# Patient Record
Sex: Female | Born: 1975 | Race: White | Hispanic: No | Smoking: Current every day smoker
Health system: Southern US, Community
[De-identification: ages and names within clinical notes are randomized; demographics above are authoritative.]

## PROBLEM LIST (undated history)

## (undated) DIAGNOSIS — M5430 Sciatica, unspecified side: Secondary | ICD-10-CM

## (undated) DIAGNOSIS — M5136 Other intervertebral disc degeneration, lumbar region: Secondary | ICD-10-CM

## (undated) DIAGNOSIS — M419 Scoliosis, unspecified: Secondary | ICD-10-CM

## (undated) DIAGNOSIS — M51369 Other intervertebral disc degeneration, lumbar region without mention of lumbar back pain or lower extremity pain: Secondary | ICD-10-CM

## (undated) HISTORY — PX: CHOLECYSTECTOMY: SHX55

---

## 1998-08-23 ENCOUNTER — Ambulatory Visit (HOSPITAL_COMMUNITY): Admission: RE | Admit: 1998-08-23 | Discharge: 1998-08-23 | Payer: Self-pay | Admitting: Obstetrics and Gynecology

## 1998-08-26 ENCOUNTER — Other Ambulatory Visit: Admission: RE | Admit: 1998-08-26 | Discharge: 1998-08-26 | Payer: Self-pay | Admitting: Obstetrics and Gynecology

## 1999-04-15 ENCOUNTER — Inpatient Hospital Stay (HOSPITAL_COMMUNITY): Admission: AD | Admit: 1999-04-15 | Discharge: 1999-04-15 | Payer: Self-pay | Admitting: *Deleted

## 1999-04-26 ENCOUNTER — Inpatient Hospital Stay (HOSPITAL_COMMUNITY): Admission: AD | Admit: 1999-04-26 | Discharge: 1999-04-26 | Payer: Self-pay | Admitting: Obstetrics & Gynecology

## 1999-05-23 ENCOUNTER — Inpatient Hospital Stay (HOSPITAL_COMMUNITY): Admission: AD | Admit: 1999-05-23 | Discharge: 1999-05-25 | Payer: Self-pay | Admitting: Obstetrics and Gynecology

## 1999-06-23 ENCOUNTER — Other Ambulatory Visit: Admission: RE | Admit: 1999-06-23 | Discharge: 1999-06-23 | Payer: Self-pay | Admitting: Obstetrics and Gynecology

## 2002-03-30 ENCOUNTER — Emergency Department (HOSPITAL_COMMUNITY): Admission: EM | Admit: 2002-03-30 | Discharge: 2002-03-30 | Payer: Self-pay | Admitting: Emergency Medicine

## 2002-11-10 ENCOUNTER — Other Ambulatory Visit: Admission: RE | Admit: 2002-11-10 | Discharge: 2002-11-10 | Payer: Self-pay | Admitting: Obstetrics and Gynecology

## 2009-12-11 ENCOUNTER — Emergency Department
Admit: 2009-12-11 | Discharge: 2009-12-13 | Disposition: A | Discharge status: Home Health | Payer: Self-pay | Attending: Emergency Medicine | Admitting: Emergency Medicine

## 2009-12-11 LAB — CBC WITH DIFF, BLOOD
Abs Basophils: 0.1 10*3/uL (ref 0.0–0.1)
Abs Eosinophils: 0.3 10*3/uL (ref 0.0–0.5)
Abs Lymphs: 3.4 10*3/uL — ABNORMAL HIGH (ref 0.8–3.1)
Abs Monos: 0.5 10*3/uL (ref 0.2–0.8)
AbsoLute Lymphocyte Count: 3.4 10*3/uL
Absolute Neutrophil Count: 8.8 10*3/uL — ABNORMAL HIGH (ref 1.6–7.0)
Bands: 3 % (ref 0–15)
Basophils: 1 % (ref 0–2)
Eosinophils: 2 % (ref 1–7)
Hct: 38.5 % (ref 34.0–45.0)
Hgb: 12.9 g/dL (ref 11.2–15.7)
Lymphocytes: 26 % (ref 19–53)
MCH: 29.8 pg (ref 26.0–32.0)
MCHC: 33.5 % (ref 32.0–36.0)
MCV: 88.9 um3 (ref 79.0–95.0)
MPV: 11.3 fL (ref 9.4–12.4)
Monocytes: 4 % — ABNORMAL LOW (ref 5–12)
Number of Cells Counted: 117
Plt Count: 280 10*3/uL (ref 160–370)
Plt Est: NORMAL
RBC: 4.33 10*6/uL (ref 3.90–5.20)
RDW: 14 % (ref 12.0–14.0)
Segs: 64 % (ref 34–71)
WBC: 13.2 10*3/uL — ABNORMAL HIGH (ref 4.0–10.0)

## 2009-12-11 LAB — COMPREHENSIVE METABOLIC PANEL, BLOOD
ALT (SGPT): INVALID U/L — CR (ref 0–33)
ALT (SGPT): 10 U/L (ref 0–33)
AST (SGOT): INVALID U/L — CR (ref 0–32)
AST (SGOT): 11 U/L (ref 0–32)
Albumin: INVALID g/dL — CR (ref 3.5–5.2)
Albumin: 4.3 g/dL (ref 3.5–5.2)
Alkaline Phos: INVALID U/L — CR (ref 35–140)
Alkaline Phos: 67 U/L (ref 35–140)
BUN: INVALID mg/dL — CR (ref 6–20)
BUN: 7 mg/dL (ref 6–20)
Bicarbonate: INVALID mmol/L — CR (ref 22–29)
Bicarbonate: 26 mmol/L (ref 22–29)
Bilirubin, Tot: INVALID mg/dL — CR (ref <1.2)
Bilirubin, Tot: 0.2 mg/dL (ref <1.2)
Calcium: INVALID mg/dL — CR (ref 8.6–10.5)
Calcium: 8.7 mg/dL (ref 8.6–10.5)
Chloride: INVALID mmol/L — CR (ref 98–107)
Chloride: 104 mmol/L (ref 98–107)
Creatinine: INVALID mg/dL — CR (ref 0.51–0.95)
Creatinine: 0.83 mg/dL (ref 0.51–0.95)
Glucose: INVALID mg/dL — CR (ref 70–115)
Glucose: 107 mg/dL (ref 70–115)
Potassium: INVALID mmol/L — CR (ref 3.5–5.1)
Potassium: 3.6 mmol/L (ref 3.5–5.1)
Sodium: INVALID mmol/L — CR (ref 136–145)
Sodium: 140 mmol/L (ref 136–145)
Total Protein: INVALID g/dL — CR (ref 6.0–8.0)
Total Protein: 6.5 g/dL (ref 6.0–8.0)

## 2009-12-11 LAB — BILIRUBIN, DIR BLOOD
Bilirubin, Dir: INVALID mg/dL — CR (ref <0.2)
Bilirubin, Dir: 0.1 mg/dL (ref <0.2)

## 2009-12-11 LAB — GFR
GFR: INVALID mL/min — CR
GFR: >60 mL/min

## 2009-12-11 LAB — URINALYSIS
Bilirubin: NEGATIVE
Glucose: NEGATIVE
Ketones: NEGATIVE
Nitrite: NEGATIVE
Specific Gravity: 1.017 (ref 1.002–1.030)
pH: 6 (ref 5.0–8.0)

## 2009-12-11 LAB — LIPASE, BLOOD
Lipase: INVALID U/L — CR (ref 13–60)
Lipase: 59 U/L (ref 13–60)

## 2009-12-11 LAB — HEMOLYSIS

## 2009-12-11 LAB — URINE HCG: HCG, Urine: NEGATIVE

## 2009-12-13 LAB — URINE CULTURE

## 2016-02-28 ENCOUNTER — Ambulatory Visit (INDEPENDENT_AMBULATORY_CARE_PROVIDER_SITE_OTHER): Payer: MEDICAID | Admitting: Anesthesiology

## 2016-02-28 NOTE — Progress Notes (Unsigned)
The patient missed the appointment.

## 2016-05-21 ENCOUNTER — Encounter (HOSPITAL_COMMUNITY): Payer: Self-pay | Admitting: *Deleted

## 2016-05-21 ENCOUNTER — Emergency Department (HOSPITAL_COMMUNITY)
Admission: EM | Admit: 2016-05-21 | Discharge: 2016-05-21 | Disposition: A | Discharge status: Home / Self Care | Payer: Medicaid - Out of State | Attending: Emergency Medicine | Admitting: Emergency Medicine

## 2016-05-21 DIAGNOSIS — M549 Dorsalgia, unspecified: Secondary | ICD-10-CM

## 2016-05-21 DIAGNOSIS — G8929 Other chronic pain: Secondary | ICD-10-CM | POA: Diagnosis not present

## 2016-05-21 DIAGNOSIS — M545 Low back pain: Secondary | ICD-10-CM | POA: Diagnosis present

## 2016-05-21 MED ORDER — PREDNISONE 20 MG PO TABS: 60.0000 mg | ORAL_TABLET | Freq: Every day | ORAL | Status: DC

## 2016-05-21 MED ORDER — HYDROCODONE-ACETAMINOPHEN 5-325 MG PO TABS: 1.0000 | ORAL_TABLET | Freq: Four times a day (QID) | ORAL | Status: DC | PRN

## 2016-05-21 NOTE — ED Notes (Signed)
Pt reports a medical Hx  Of DDD  And chronic back pain. Pt needs refill on pain meds while waiting MD appt.

## 2016-05-21 NOTE — Discharge Instructions (Signed)
Please read and follow all provided instructions.  Your diagnoses today include:  1. Chronic back pain    Medications prescribed:   Take any prescribed medications only as directed.  Home care instructions:   Follow any educational materials contained in this packet  Please rest, use ice or heat on your back for the next several days  Do not lift, push, pull anything more than 10 pounds for the next week  Follow-up instructions: Please follow-up with your primary care provider in the next 1 week for further evaluation of your symptoms.   Return instructions:  SEEK IMMEDIATE MEDICAL ATTENTION IF YOU HAVE:  New numbness, tingling, weakness, or problem with the use of your arms or legs  Severe back pain not relieved with medications  Loss control of your bowels or bladder  Increasing pain in any areas of the body (such as chest or abdominal pain)  Shortness of breath, dizziness, or fainting.   Worsening nausea (feeling sick to your stomach), vomiting, fever, or sweats  Any other emergent concerns regarding your health

## 2016-05-21 NOTE — ED Notes (Signed)
Declined W/C at D/C and was escorted to lobby by RN. 

## 2016-05-21 NOTE — ED Provider Notes (Signed)
CSN: [id number]     Arrival date & time 05/21/16  [id number] History  By signing my name below, I, Tanda RockersMargaux Venter, attest that this documentation has been prepared under the direction and in the presence of Audry Piliyler Milind Raether, PA-C.  Electronically Signed: Tanda RockersMargaux Venter, ED Scribe. 05/21/2016. 9:15 AM.   No chief complaint on file.  The history is provided by the patient. No language interpreter was used.     HPI Comments: Madison Blackburn is a 40 y.o. female with PMHx DDD, who presents to the Emergency Department complaining of acute on chronic, 7/10, pulling, lower back pain x 1-2 weeks. Pt moved to the area from [place] 2 weeks ago and recently ran out of her medications. Her doctor in [place] could not call prescriptions over the phone, prompting her to come to the ED today. Pt has been taking muscle relaxants with mild relief. Pt does not know who she will see in the area but is asking for a referral to get started. Denies urinary or bowel incontinence, saddle anesthesia, gait difficulty, fever, or any other associated symptoms. No hx DM.   No past medical history on file. No past surgical history on file. No family history on file. Social History  Substance Use Topics  . Smoking status: Not on file  . Smokeless tobacco: Not on file  . Alcohol Use: Not on file   OB History    No data available     Review of Systems  Gastrointestinal:       Negative for urinary incontinence  Genitourinary:       Negative for bowel incontinence  Musculoskeletal: Positive for back pain. Negative for gait problem.  Neurological: Negative for numbness.   Allergies  Review of patient's allergies indicates not on file.  Home Medications   Prior to Admission medications   Not on File   There were no vitals taken for this visit.   Physical Exam  Constitutional: She is oriented to person, place, and time. She appears well-developed and well-nourished. No distress.  HENT:  Head: Normocephalic and  atraumatic.  Eyes: Conjunctivae and EOM are normal.  Neck: Neck supple. No tracheal deviation present.  Cardiovascular: Normal rate.   Pulmonary/Chest: Effort normal. No respiratory distress.  Musculoskeletal: Normal range of motion.       Lumbar back: She exhibits bony tenderness. She exhibits normal range of motion, no tenderness, no deformity, no laceration, no pain, no spasm and normal pulse.  Pt able to ambulate without difficulty   Neurological: She is alert and oriented to person, place, and time.  Skin: Skin is warm and dry.  Psychiatric: She has a normal mood and affect. Her behavior is normal.  Nursing note and vitals reviewed.  ED Course  Procedures (including critical care time)  COORDINATION OF CARE: 9:14 AM-Discussed treatment plan which includes Rx pain medication and prednisone with pt at bedside and pt agreed to plan.   Labs Review Labs Reviewed - No data to display  Imaging Review No results found. I have personally reviewed and evaluated these images and lab results as part of my medical decision-making.   EKG Interpretation None      MDM  I have reviewed the relevant previous healthcare records. I obtained HPI from historian.  ED Course:  Assessment: Patient with back pain.  No neurological deficits and normal neuro exam.  Patient is ambulatory.  No loss of bowel or bladder control.  No concern for cauda equina.  No fever, night sweats, weight loss,  h/o cancer, IVDA, no recent procedure to back. No urinary symptoms suggestive of UTI.  Supportive care and return precaution discussed. Appears safe for discharge at this time. Follow up as indicated in discharge paperwork.   Disposition/Plan:  DC Home Additional Verbal discharge instructions given and discussed with patient.  Pt Instructed to f/u with Ortho in the next week for evaluation and treatment of symptoms. Return precautions given Pt acknowledges and agrees with plan  Supervising Physician  Tilden FossaElizabeth Rees, MD   Final diagnoses:  Chronic back pain     I personally performed the services described in this documentation, which was scribed in my presence. The recorded information has been reviewed and is accurate.    Audry Piliyler Sandia Pfund, PA-C 05/21/16 [id number]  Tilden FossaElizabeth Rees, MD 05/23/16 [phone]

## 2016-05-23 ENCOUNTER — Ambulatory Visit (HOSPITAL_COMMUNITY)
Admission: EM | Admit: 2016-05-23 | Discharge: 2016-05-23 | Disposition: A | Discharge status: Home / Self Care | Payer: Medicaid - Out of State | Attending: Physician Assistant | Admitting: Physician Assistant

## 2016-05-23 ENCOUNTER — Encounter (HOSPITAL_COMMUNITY): Payer: Self-pay | Admitting: Emergency Medicine

## 2016-05-23 DIAGNOSIS — M549 Dorsalgia, unspecified: Principal | ICD-10-CM

## 2016-05-23 DIAGNOSIS — G8929 Other chronic pain: Secondary | ICD-10-CM

## 2016-05-23 MED ORDER — HYDROCODONE-ACETAMINOPHEN 10-325 MG PO TABS: 1.0000 | ORAL_TABLET | Freq: Four times a day (QID) | ORAL | Status: DC | PRN

## 2016-05-23 NOTE — ED Notes (Signed)
Pt d/c by frank p, pa 

## 2016-05-23 NOTE — Discharge Instructions (Signed)
Back Exercises °The following exercises strengthen the muscles that help to support the back. They also help to keep the lower back flexible. Doing these exercises can help to prevent back pain or lessen existing pain. °If you have back pain or discomfort, try doing these exercises 2-3 times each day or as told by your health care provider. When the pain goes away, do them once each day, but increase the number of times that you repeat the steps for each exercise (do more repetitions). If you do not have back pain or discomfort, do these exercises once each day or as told by your health care provider. °EXERCISES °Single Knee to Chest °Repeat these steps 3-5 times for each leg: °· Lie on your back on a firm bed or the floor with your legs extended. °· Bring one knee to your chest. Your other leg should stay extended and in contact with the floor. °· Hold your knee in place by grabbing your knee or thigh. °· Pull on your knee until you feel a gentle stretch in your lower back. °· Hold the stretch for 10-30 seconds. °· Slowly release and straighten your leg. °Pelvic Tilt °Repeat these steps 5-10 times: °· Lie on your back on a firm bed or the floor with your legs extended. °· Bend your knees so they are pointing toward the ceiling and your feet are flat on the floor. °· Tighten your lower abdominal muscles to press your lower back against the floor. This motion will tilt your pelvis so your tailbone points up toward the ceiling instead of pointing to your feet or the floor. °· With gentle tension and even breathing, hold this position for 5-10 seconds. °Cat-Cow °Repeat these steps until your lower back becomes more flexible: °· Get into a hands-and-knees position on a firm surface. Keep your hands under your shoulders, and keep your knees under your hips. You may place padding under your knees for comfort. °· Let your head hang down, and point your tailbone toward the floor so your lower back becomes rounded like the  back of a cat. °· Hold this position for 5 seconds. °· Slowly lift your head and point your tailbone up toward the ceiling so your back forms a sagging arch like the back of a cow. °· Hold this position for 5 seconds. °Press-Ups °Repeat these steps 5-10 times: °· Lie on your abdomen (face-down) on the floor. °· Place your palms near your head, about shoulder-width apart. °· While you keep your back as relaxed as possible and keep your hips on the floor, slowly straighten your arms to raise the top half of your body and lift your shoulders. Do not use your back muscles to raise your upper torso. You may adjust the placement of your hands to make yourself more comfortable. °· Hold this position for 5 seconds while you keep your back relaxed. °· Slowly return to lying flat on the floor. °Bridges °Repeat these steps 10 times: °1. Lie on your back on a firm surface. °2. Bend your knees so they are pointing toward the ceiling and your feet are flat on the floor. °3. Tighten your buttocks muscles and lift your buttocks off of the floor until your waist is at almost the same height as your knees. You should feel the muscles working in your buttocks and the back of your thighs. If you do not feel these muscles, slide your feet 1-2 inches farther away from your buttocks. °4. Hold this position for 3-5   seconds. °5. Slowly lower your hips to the starting position, and allow your buttocks muscles to relax completely. °If this exercise is too easy, try doing it with your arms crossed over your chest. °Abdominal Crunches °Repeat these steps 5-10 times: °1. Lie on your back on a firm bed or the floor with your legs extended. °2. Bend your knees so they are pointing toward the ceiling and your feet are flat on the floor. °3. Cross your arms over your chest. °4. Tip your chin slightly toward your chest without bending your neck. °5. Tighten your abdominal muscles and slowly raise your trunk (torso) high enough to lift your shoulder  blades a tiny bit off of the floor. Avoid raising your torso higher than that, because it can put too much stress on your low back and it does not help to strengthen your abdominal muscles. °6. Slowly return to your starting position. °Back Lifts °Repeat these steps 5-10 times: °1. Lie on your abdomen (face-down) with your arms at your sides, and rest your forehead on the floor. °2. Tighten the muscles in your legs and your buttocks. °3. Slowly lift your chest off of the floor while you keep your hips pressed to the floor. Keep the back of your head in line with the curve in your back. Your eyes should be looking at the floor. °4. Hold this position for 3-5 seconds. °5. Slowly return to your starting position. °SEEK MEDICAL CARE IF: °· Your back pain or discomfort gets much worse when you do an exercise. °· Your back pain or discomfort does not lessen within 2 hours after you exercise. °If you have any of these problems, stop doing these exercises right away. Do not do them again unless your health care provider says that you can. °SEEK IMMEDIATE MEDICAL CARE IF: °· You develop sudden, severe back pain. If this happens, stop doing the exercises right away. Do not do them again unless your health care provider says that you can. °  °This information is not intended to replace advice given to you by your health care provider. Make sure you discuss any questions you have with your health care provider. °  °Document Released: 12/21/2004 Document Revised: 08/04/2015 Document Reviewed: 01/07/2015 °Elsevier Interactive Patient Education ©2016 Else[employer] Inc[employer] ° °Back Pain, Adult °Back pain is very common in adults. The cause of back pain is rarely dangerous and the pain often gets better over time. The cause of your back pain may not be known. Some common causes of back pain include: °· Strain of the muscles or ligaments supporting the spine. °· Wear and tear (degeneration) of the spinal disks. °· Arthritis. °· Direct injury  to the back. °For many people, back pain may return. Since back pain is rarely dangerous, most people can learn to manage this condition on their own. °HOME CARE INSTRUCTIONS °Watch your back pain for any changes. The following actions may help to lessen any discomfort you are feeling: °· Remain active. It is stressful on your back to sit or stand in one place for long periods of time. Do not sit, drive, or stand in one place for more than 30 minutes at a time. Take short walks on even surfaces as soon as you are able. Try to increase the length of time you walk each day. °· Exercise regularly as directed by your health care provider. Exercise helps your back heal faster. It also helps avoid future injury by keeping your muscles strong and flexible. °· Do not stay in   bed. Resting more than 1-2 days can delay your recovery. °· Pay attention to your body when you bend and lift. The most comfortable positions are those that put less stress on your recovering back. Always use proper lifting techniques, including: °¨ Bending your knees. °¨ Keeping the load close to your body. °¨ Avoiding twisting. °· Find a comfortable position to sleep. Use a firm mattress and lie on your side with your knees slightly bent. If you lie on your back, put a pillow under your knees. °· Avoid feeling anxious or stressed. Stress increases muscle tension and can worsen back pain. It is important to recognize when you are anxious or stressed and learn ways to manage it, such as with exercise. °· Take medicines only as directed by your health care provider. Over-the-counter medicines to reduce pain and inflammation are often the most helpful. Your health care provider may prescribe muscle relaxant drugs. These medicines help dull your pain so you can more quickly return to your normal activities and healthy exercise. °· Apply ice to the injured area: °¨ Put ice in a plastic bag. °¨ Place a towel between your skin and the bag. °¨ Leave the ice on  for 20 minutes, 2-3 times a day for the first 2-3 days. After that, ice and heat may be alternated to reduce pain and spasms. °· Maintain a healthy weight. Excess weight puts extra stress on your back and makes it difficult to maintain good posture. °SEEK MEDICAL CARE IF: °· You have pain that is not relieved with rest or medicine. °· You have increasing pain going down into the legs or buttocks. °· You have pain that does not improve in one week. °· You have night pain. °· You lose weight. °· You have a fever or chills. °SEEK IMMEDIATE MEDICAL CARE IF:  °· You develop new bowel or bladder control problems. °· You have unusual weakness or numbness in your arms or legs. °· You develop nausea or vomiting. °· You develop abdominal pain. °· You feel faint. °  °This information is not intended to replace advice given to you by your health care provider. Make sure you discuss any questions you have with your health care provider. °  °Document Released: 11/13/2005 Document Revised: 12/04/2014 Document Reviewed: 03/17/2014 °Elsevier Interactive Patient Education ©2016 Elsevier Inc. ° °

## 2016-05-23 NOTE — ED Notes (Signed)
Here for back pain onset x4 days and medication refill on meds Reports hx of chronic back pain/DDD Just moved to [place] x1 week A&O x4... No acute distress.

## 2016-05-31 ENCOUNTER — Emergency Department (HOSPITAL_COMMUNITY)
Admission: EM | Admit: 2016-05-31 | Discharge: 2016-05-31 | Disposition: A | Discharge status: Home / Self Care | Payer: Medicaid - Out of State | Attending: Emergency Medicine | Admitting: Emergency Medicine

## 2016-05-31 ENCOUNTER — Encounter (HOSPITAL_COMMUNITY): Payer: Self-pay | Admitting: Emergency Medicine

## 2016-05-31 DIAGNOSIS — M545 Low back pain: Secondary | ICD-10-CM

## 2016-05-31 DIAGNOSIS — F17210 Nicotine dependence, cigarettes, uncomplicated: Secondary | ICD-10-CM | POA: Insufficient documentation

## 2016-05-31 DIAGNOSIS — G8929 Other chronic pain: Secondary | ICD-10-CM

## 2016-05-31 DIAGNOSIS — M5440 Lumbago with sciatica, unspecified side: Secondary | ICD-10-CM | POA: Insufficient documentation

## 2016-05-31 HISTORY — DX: Other intervertebral disc degeneration, lumbar region: M51.36

## 2016-05-31 HISTORY — DX: Sciatica, unspecified side: M54.30

## 2016-05-31 HISTORY — DX: Other intervertebral disc degeneration, lumbar region without mention of lumbar back pain or lower extremity pain: M51.369

## 2016-05-31 HISTORY — DX: Scoliosis, unspecified: M41.9

## 2016-05-31 MED ORDER — OXYCODONE-ACETAMINOPHEN 5-325 MG PO TABS
1.0000 | ORAL_TABLET | Freq: Once | ORAL | Status: AC
  Administered 2016-05-31: 1 via ORAL
  Filled 2016-05-31: qty 1

## 2016-05-31 MED ORDER — TRAMADOL HCL 50 MG PO TABS: 50.0000 mg | ORAL_TABLET | Freq: Four times a day (QID) | ORAL | Status: DC | PRN

## 2016-05-31 MED ORDER — MELOXICAM 7.5 MG PO TABS: 7.5000 mg | ORAL_TABLET | Freq: Every day | ORAL | Status: DC

## 2016-05-31 MED ORDER — NAPROXEN 500 MG PO TABS
500.0000 mg | ORAL_TABLET | Freq: Once | ORAL | Status: AC
  Administered 2016-05-31: 500 mg via ORAL
  Filled 2016-05-31: qty 1

## 2016-05-31 NOTE — Progress Notes (Signed)
CSW was consulted by PA to speak with patient regarding insurance.  CSW met with patient at bedside. Patient was alert and oriented. Patient states that she presents to [facility] due to back pain. Patient states that she is from [place], but has been staying in [place], [place] for the past 2 weeks. Patient states that she has lived in [place] before.  Patient states that she is employed and lives with her family. Patient reports that she has a good support system, and has no issues with shelter or food at this time. However, patient states that she is looking for a physician and insurance. CSW provided the patient with information for [place] in order for her to speak with someone regarding insurance. Also, CSW consulted with Nurse CM and informed her about patient having issues with finding a physician.   Patient states that she has no questions for CSW at this time.  CSW made PA aware.   Willette Brace [phone] ED CSW 05/31/2016 5:26 PM

## 2016-05-31 NOTE — ED Notes (Signed)
Bed: WA27 Expected date:  Expected time:  Means of arrival:  Comments: Fast track

## 2016-05-31 NOTE — Discharge Instructions (Signed)

## 2016-05-31 NOTE — ED Notes (Signed)
Pt states that she has DDD in her back and chronic back pain but she has not been able to establish a PCP here yet since she just moved from [place] and no one takes her insurance. Alert and oriented. Takes norco 10-325 normally.

## 2016-05-31 NOTE — ED Provider Notes (Signed)
CSN: [id number]     Arrival date & time 05/31/16  1611 History   By signing my name below, I, Marisue HumbleMichelle Chaffee, attest that this documentation has been prepared under the direction and in the presence of non-physician practitioner, Cheri FowlerKayla Robbert Langlinais, PA-C. Electronically Signed: Marisue HumbleMichelle Chaffee, Scribe. 05/31/2016. 4:36 PM.   Chief Complaint  Patient presents with  . Back Pain    The history is provided by the patient. No language interpreter was used.   HPI Comments:  Madison Blackburn is a 40 y.o. female with PMHx of scoliosis, sciatica DDD who presents to the Emergency Department complaining of chronic low back pain for the past 8 years. No alleviating factors noted. Pt recently moved here from [place] and has not been able to find a PCP here due to insurance difficulties. Pt states she is here for a refill of her pain medication. Denies fall, injury, trauma, bowel or bladder incontinence, numbness, weakness, IV drug use, abdominal pain, dysuria or hematuria.  Past Medical History  Diagnosis Date  . DDD (degenerative disc disease), lumbar   . Scoliosis   . Sciatica    Past Surgical History  Procedure Laterality Date  . Cholecystectomy     No family history on file. Social History  Substance Use Topics  . Smoking status: Current Some Day Smoker    Types: Cigarettes  . Smokeless tobacco: Never Used  . Alcohol Use: No   OB History    No data available     Review of Systems  Gastrointestinal: Negative for abdominal pain.  Genitourinary: Negative for dysuria and hematuria.  Musculoskeletal: Positive for back pain.  Neurological: Negative for weakness and numbness.    Allergies  Review of patient's allergies indicates no known allergies.  Home Medications   Prior to Admission medications   Medication Sig Start Date End Date Taking? Authorizing Provider  HYDROcodone-acetaminophen (NORCO) 10-325 MG tablet Take 1 tablet by mouth every 6 (six) hours as needed. 05/23/16   Tharon AquasFrank C Patrick, PA   HYDROcodone-acetaminophen (NORCO/VICODIN) 5-325 MG tablet Take 1 tablet by mouth every 6 (six) hours as needed for severe pain. 05/21/16   Audry Piliyler Mohr, PA-C  meloxicam (MOBIC) 7.5 MG tablet Take 1 tablet (7.5 mg total) by mouth daily. 05/31/16   Cheri FowlerKayla Geffrey Michaelsen, PA-C  predniSONE (DELTASONE) 20 MG tablet Take 3 tablets (60 mg total) by mouth daily with breakfast. 05/21/16   Audry Piliyler Mohr, PA-C  traMADol (ULTRAM) 50 MG tablet Take 1 tablet (50 mg total) by mouth every 6 (six) hours as needed. 05/31/16   Gabby Rackers, PA-C   BP 150/87 mmHg  Pulse 103  Temp(Src) 97.8 F (36.6 C) (Oral)  Resp 18  SpO2 100%  LMP 04/27/2016   Physical Exam  Constitutional: She is oriented to person, place, and time. She appears well-developed and well-nourished.  HENT:  Head: Atraumatic.  Eyes: Conjunctivae are normal.  Cardiovascular: Normal rate, regular rhythm, normal heart sounds and intact distal pulses.   Pulses:      Dorsalis pedis pulses are 2+ on the right side, and 2+ on the left side.  Pulmonary/Chest: Effort normal and breath sounds normal.  Abdominal: Soft. Bowel sounds are normal. She exhibits no distension. There is no tenderness.  Musculoskeletal: She exhibits tenderness.  No spinous process tenderness.  No step offs. No crepitus.  Diffuse lumbosacral tenderness.   Neurological: She is alert and oriented to person, place, and time.  No saddle anesthesia. Strength and sensation intact bilaterally throughout lower extremities. Normal gait.  Skin: Skin is warm and dry.  Psychiatric: She has a normal mood and affect. Her behavior is normal.    ED Course  Procedures   DIAGNOSTIC STUDIES: Oxygen Saturation is 100% on RA, normal by my interpretation.    COORDINATION OF CARE: 4:32 PM Recommended antiinflammatory pain medication. Will give pt resources to find a PCP and pain clinic. Will have case manager talk to pt prior to discharge. Discussed treatment plan with pt at bedside and pt agreed to plan.  Labs  Review Labs Reviewed - No data to display  Imaging Review No results found. I have personally reviewed and evaluated these images and lab results as part of my medical decision-making.   EKG Interpretation None      MDM   Final diagnoses:  Chronic pain  Bilateral low back pain, with sciatica presence unspecified   Patient presents with chronic back pain.  No new changes or injuries.  No numbness, weakness, b/b incontinence.  No red flags.  Normal neurological exam with no focal deficits.  Ambulatory.  No indication for imaging at this time. Patient seen here multiple times for same.  Given Percocet and Naproxen in ED.  Patient recently moved from [place] and setting up insurance.  Patient needs primary care or pain clinic for chronic pain.  Social work consulted.  Given follow up to establish primary care.  Discussed return precautions.  Patient agrees and acknowledges the above plan for discharge.   I personally performed the services described in this documentation, which was scribed in my presence. The recorded information has been reviewed and is accurate.    Cheri FowlerKayla Tamica Covell, PA-C 05/31/16 1751  Nelva Nayobert Beaton, MD 05/31/16 [id number]

## 2016-05-31 NOTE — Progress Notes (Signed)
EDCm consulted by EDSW to speak to patient regarding insurance and pcp issues.  EDCM spoke to patient at bedside. Patient confirms she does [place] have a pcp or insurance [place].  [facility], informed patient of services there and walk in times.  EDCM also provided patient with list of pcps who accept self pay patients, list of discount pharmacies and websites needymeds.org and GoodRX.com for medication assistance, phone number to inquire about the orange card, phone number to inquire about Mediciad, phone number to inquire about the Affordable Care Act, financial resources in the community such as local churches, [employer], urban ministries, and dental assistance for uninsured patients.  Patient thankful for resources.  No further EDCM needs at this time.  Patient reports she [place] Medicaid from [place] of [place], [place].  [facility] informed patient that she will need to apply for Medicaid her in [place].  Patient is fearful that she will not be able to obtain Medicaid here.  [facility] provided patient with contact information to the DSS of [place].   Discussed Obama care option and orange card.  Patient requesting [place] of [place] clinics [place].  EDCM provided a list of pain clinics in [place].  Patient thankful for resources.  No further EDCm needs at this time.

## 2016-06-04 ENCOUNTER — Encounter (HOSPITAL_COMMUNITY): Payer: Self-pay

## 2016-06-04 ENCOUNTER — Emergency Department (HOSPITAL_COMMUNITY)
Admission: EM | Admit: 2016-06-04 | Discharge: 2016-06-04 | Disposition: A | Discharge status: Home / Self Care | Payer: Medicaid - Out of State | Attending: Emergency Medicine | Admitting: Emergency Medicine

## 2016-06-04 DIAGNOSIS — Z7289 Other problems related to lifestyle: Secondary | ICD-10-CM | POA: Insufficient documentation

## 2016-06-04 DIAGNOSIS — F17210 Nicotine dependence, cigarettes, uncomplicated: Secondary | ICD-10-CM | POA: Insufficient documentation

## 2016-06-04 DIAGNOSIS — M62830 Muscle spasm of back: Secondary | ICD-10-CM | POA: Diagnosis not present

## 2016-06-04 DIAGNOSIS — G8929 Other chronic pain: Secondary | ICD-10-CM | POA: Insufficient documentation

## 2016-06-04 DIAGNOSIS — Z765 Malingerer [conscious simulation]: Secondary | ICD-10-CM

## 2016-06-04 DIAGNOSIS — M549 Dorsalgia, unspecified: Secondary | ICD-10-CM

## 2016-06-04 DIAGNOSIS — M545 Low back pain: Secondary | ICD-10-CM | POA: Diagnosis not present

## 2016-06-04 DIAGNOSIS — Z79899 Other long term (current) drug therapy: Secondary | ICD-10-CM | POA: Diagnosis not present

## 2016-06-04 NOTE — ED Provider Notes (Signed)
CSN: [id number]     Arrival date & time 06/04/16  1308 History  By signing my name below, I, [name], attest that this documentation has been prepared under the direction and in the presence of [name], [name] Electronically Signed: [name], ED Scribe. 06/04/2016. 9:48 AM.   Chief Complaint  Patient presents with  . Back Pain     Patient is a 40 y.o. female presenting with back pain. The history is provided by the patient and medical records. No language interpreter was used.  Back Pain Location:  Lumbar spine Quality: tugging. Radiates to:  Does not radiate Pain severity:  Moderate Pain is:  Same all the time Onset quality:  Gradual Duration:  5 days (chronic) Timing:  Constant Progression:  Unchanged Chronicity:  Chronic Context: not falling, not lifting heavy objects, not occupational injury, not physical stress, not recent injury and not twisting   Relieved by:  Narcotics Worsened by:  Bending and movement Ineffective treatments:  Muscle relaxants and NSAIDs Associated symptoms: no abdominal pain, no bladder incontinence, no bowel incontinence, no chest pain, no dysuria, no fever, no numbness, no paresthesias, no perianal numbness, no tingling and no weakness   Risk factors: no hx of cancer     Madison Blackburn is a 40 y.o. female with a PMHx of DDD, scoliosis, and sciatica, who presents to the Emergency department complaining of chronic lower back pain onset 5 days. Pt notes that she recently moved back to the area from [place] and she hasn't been able to be seen by a provider due to insurance issues. Pt reports that she is currently in the process of acquiring health insurance. Denies any recent injury, heavy lifting, trauma, or fall. Pt states that she has had a MRI for her chronic lower back pain while in [place] with her past PCP. Chart review reveals she was seen on 05/21/16 and given 15 tabs norco and prednisone; on 05/23/16 seen at [facility] and given 30 tabs  vicodin 10-325mg ; and on 05/31/16 given 8 tabs tramadol and mobic. Also had social work consult that day. She is coming here because she needs more narcotic refills and hasn't yet been able to establish medical care. Denies acute changes in her back pain. Describes it as 10/10, tugging sensation, constant, and it does not radiate. She states that working and bending worsens her lower back pain. She reports that she has tried Rx mobic, flexeril, and prednisone, with no relief for her symptoms. Narcotic rx's help with her pain.  Pt denies bowel/bladder incontinence, saddle anesthesia or cauda equina symptoms, fever, chills, CP, SOB, abdominal pain, n/v/d/c, dysuria, hematuria, numbness, tingling, weakness, and any other symptoms. Denies CA or IV drug use.    Past Medical History  Diagnosis Date  . DDD (degenerative disc disease), lumbar   . Scoliosis   . Sciatica    Past Surgical History  Procedure Laterality Date  . Cholecystectomy     No family history on file. Social History  Substance Use Topics  . Smoking status: Current Some Day Smoker    Types: Cigarettes  . Smokeless tobacco: Never Used  . Alcohol Use: No   OB History    No data available     Review of Systems  Constitutional: Negative for fever and chills.  Respiratory: Negative for shortness of breath.   Cardiovascular: Negative for chest pain.  Gastrointestinal: Negative for nausea, vomiting, abdominal pain, diarrhea and bowel incontinence.  Genitourinary: Negative for bladder incontinence, dysuria, hematuria and difficulty urinating (  no incontinence).  Musculoskeletal: Positive for back pain (lower).  Skin: Negative for color change.  Allergic/Immunologic: Negative for immunocompromised state.  Neurological: Negative for tingling, weakness, numbness and paresthesias.  Psychiatric/Behavioral: Negative for confusion.    A complete 10 system review of systems was obtained and all systems are negative except as noted in the  HPI and PMH.   Allergies  Review of patient's allergies indicates no known allergies.  Home Medications   Prior to Admission medications   Medication Sig Start Date End Date Taking? Authorizing Provider  HYDROcodone-acetaminophen (NORCO) 10-325 MG tablet Take 1 tablet by mouth every 6 (six) hours as needed. 05/23/16   Tharon AquasFrank C Patrick, PA  HYDROcodone-acetaminophen (NORCO/VICODIN) 5-325 MG tablet Take 1 tablet by mouth every 6 (six) hours as needed for severe pain. 05/21/16   Audry Piliyler Mohr, PA-C  meloxicam (MOBIC) 7.5 MG tablet Take 1 tablet (7.5 mg total) by mouth daily. 05/31/16   [name], PA-C  predniSONE (DELTASONE) 20 MG tablet Take 3 tablets (60 mg total) by mouth daily with breakfast. 05/21/16   Audry Piliyler Mohr, PA-C  traMADol (ULTRAM) 50 MG tablet Take 1 tablet (50 mg total) by mouth every 6 (six) hours as needed. 05/31/16   [name], PA-C   BP 132/90 mmHg  Pulse 76  Temp(Src) 98.5 F (36.9 C) (Oral)  Resp 18  SpO2 99%  LMP 04/27/2016 Physical Exam  Constitutional: She is oriented to person, place, and time. Vital signs are normal. She appears well-developed and well-nourished.  Non-toxic appearance. No distress.  Afebrile, nontoxic, NAD  HENT:  Head: Normocephalic and atraumatic.  Mouth/Throat: Mucous membranes are normal.  Eyes: Conjunctivae and EOM are normal. Right eye exhibits no discharge. Left eye exhibits no discharge.  Neck: Normal range of motion. Neck supple.  Cardiovascular: Normal rate and intact distal pulses.   Pulmonary/Chest: Effort normal. No respiratory distress.  Abdominal: Normal appearance. She exhibits no distension.  Musculoskeletal: Normal range of motion.       Lumbar back: She exhibits tenderness and spasm. She exhibits normal range of motion, no bony tenderness and no deformity.  Lumbar spine with FROM intact without spinous process TTP, no bony stepoffs or deformities, with mild bilateral paraspinous muscle TTP and muscle spasms. Strength 5/5 in all  extremities, sensation grossly intact in all extremities, +SLR on the left, gait steady and nonantalgic. No overlying skin changes. Distal pulses intact.  Neurological: She is alert and oriented to person, place, and time. She has normal strength. No sensory deficit.  Skin: Skin is warm, dry and intact. No rash noted.  Psychiatric: She has a normal mood and affect. Her behavior is normal.  Nursing note and vitals reviewed.   ED Course  Procedures (including critical care time) DIAGNOSTIC STUDIES: Oxygen Saturation is 99% on RA, nl by my interpretation.    COORDINATION OF CARE: 9:47 AM Discussed treatment plan with pt at bedside which includes referral to [facility] and pt agreed to plan.  9:48 AM- When pt was informed that she would not receive a narcotic prescription for her chronic lower back pain, she stormed out of the ED.    MDM   Final diagnoses:  Chronic back pain  Drug-seeking behavior  Muscle spasm of back    40 y.o. female here with chronic back pain. No red flag s/s of low back pain. No s/s of central cord compression or cauda equina. Lower extremities are neurovascularly intact and patient is ambulating without difficulty. This is her fourth  visit in 2 weeks, all for chronic back pain, and she has been given multiple rx's for narcotics by different providers, including one rx for #30 tablets of vicodin. No midline spinal tenderness, no acute injury or reasoning for emergent imaging. Discussed that chronic pain cannot be managed in the ER and no more narcotics can be written. Advised that she can f/up with [facility] or one of the primary care resources given to her the other day. At that time, she became irate and demanded narcotics, to which I declined and stated my reasoning for not using narcotics. She proceeded to tell me that this was a waste of her time and she promptly got up and stormed out of the ER prior to receiving her instructions.  Patient urged to  follow-up with [facility] before she left. The patient verbalizes understanding prior to storming out of the ER.   I personally performed the services described in this documentation, which was scribed in my presence. The recorded information has been reviewed and is accurate.  BP 132/90 mmHg  Pulse 76  Temp(Src) 98.5 F (36.9 C) (Oral)  Resp 18  SpO2 99%  LMP 04/27/2016  No orders of the defined types were placed in this encounter.      Meggie Laseter Camprubi-Soms, PA-C 06/04/16 1610  Margarita Grizzle, MD 06/04/16 1745

## 2016-06-04 NOTE — Discharge Instructions (Signed)
Back Pain: Your back pain should be treated with medicines such as ibuprofen or aleve and this back pain should get better over the next 2 weeks.  However if you develop severe or worsening pain, low back pain with fever, numbness, weakness or inability to walk or urinate, you should return to the ER immediately.  Please follow up with your doctor this week for a recheck if still having symptoms.  Avoid heavy lifting over 10 pounds over the next two weeks.  Low back pain is discomfort in the lower back that may be due to injuries to muscles and ligaments around the spine.  Occasionally, it may be caused by a a problem to a part of the spine called a disc.  The pain may last several days or a week;  However, most patients get completely well in 4 weeks.  Self - care:  The application of heat can help soothe the pain.  Maintaining your daily activities, including walking, is encourged, as it will help you get better faster than just staying in bed. Perform gentle stretching as discussed. Drink plenty of fluids.  Medications are also useful to help with pain control.  A commonly prescribed medication includes tylenol.  Non steroidal anti inflammatory medications including Ibuprofen and naproxen;  These medications help both pain and swelling and are very useful in treating back pain.  They should be taken with food, as they can cause stomach upset, and more seriously, stomach bleeding.    Home Muscle relaxants:  These medications can help with muscle tightness that is a cause of lower back pain.  Most of these medications can cause drowsiness, and it is not safe to drive or use dangerous machinery while taking them.  SEEK IMMEDIATE MEDICAL ATTENTION IF: New numbness, tingling, weakness, or problem with the use of your arms or legs.  Severe back pain not relieved with medications.  Difficulty with or loss of control of your bowel or bladder control.  Increasing pain in any areas of the body (such as  chest or abdominal pain).  Shortness of breath, dizziness or fainting.  Nausea (feeling sick to your stomach), vomiting, fever, or sweats.  You will need to follow up with [facility] in 1-2 weeks for reassessment and ongoing medical care.   Chronic Back Pain  When back pain lasts longer than 3 months, it is called chronic back pain.People with chronic back pain often go through certain periods that are more intense (flare-ups).  CAUSES Chronic back pain can be caused by wear and tear (degeneration) on different structures in your back. These structures include:  The bones of your spine (vertebrae) and the joints surrounding your spinal cord and nerve roots (facets).  The strong, fibrous tissues that connect your vertebrae (ligaments). Degeneration of these structures may result in pressure on your nerves. This can lead to constant pain. HOME CARE INSTRUCTIONS  Avoid bending, heavy lifting, prolonged sitting, and activities which make the problem worse.  Take brief periods of rest throughout the day to reduce your pain. Lying down or standing usually is better than sitting while you are resting.  Take over-the-counter or prescription medicines only as directed by your caregiver. SEEK IMMEDIATE MEDICAL CARE IF:   You have weakness or numbness in one of your legs or feet.  You have trouble controlling your bladder or bowels.  You have nausea, vomiting, abdominal pain, shortness of breath, or fainting.   This information is not intended to replace advice given to  you by your health care provider. Make sure you discuss any questions you have with your health care provider.   Document Released: 12/21/2004 Document Revised: 02/05/2012 Document Reviewed: 05/03/2015 Elsevier Interactive Patient Education 2016 [place] Injury Prevention Back injuries can be very painful. They can also be difficult to heal. After having one back injury, you are more likely to  injure your back again. It is important to learn how to avoid injuring or re-injuring your back. The following tips can help you to prevent a back injury. WHAT SHOULD I KNOW ABOUT PHYSICAL FITNESS?  Exercise for 30 minutes per day on most days of the week or as told by your doctor. Make sure to:  Do aerobic exercises, such as walking, jogging, biking, or swimming.  Do exercises that increase balance and strength, such as tai chi and yoga.  Do stretching exercises. This helps with flexibility.  Try to develop strong belly (abdominal) muscles. Your belly muscles help to support your back.  Stay at a healthy weight. This helps to decrease your risk of a back injury. WHAT SHOULD I KNOW ABOUT MY DIET?  Talk with your doctor about your overall diet. Take supplements and vitamins only as told by your doctor.  Talk with your doctor about how much calcium and vitamin D you need each day. These nutrients help to prevent weakening of the bones (osteoporosis).  Include good sources of calcium in your diet, such as:  Dairy products.  Green leafy vegetables.  Products that have had calcium added to them (fortified).  Include good sources of vitamin D in your diet, such as:  Milk.  Foods that have had vitamin D added to them. WHAT SHOULD I KNOW ABOUT MY POSTURE?  Sit up straight and stand up straight. Avoid leaning forward when you sit or hunching over when you stand.  Choose chairs that have good low-back (lumbar) support.  If you work at a desk, sit close to it so you do not need to lean over. Keep your chin tucked in. Keep your neck drawn back. Keep your elbows bent so your arms look like the letter "L" (right angle).  Sit high and close to the steering wheel when you drive. Add a low-back support to your car seat, if needed.  Avoid sitting or standing in one position for very long. Take breaks to get up, stretch, and walk around at least one time every hour. Take breaks every hour if  you are driving for long periods of time.  Sleep on your side with your knees slightly bent, or sleep on your back with a pillow under your knees. Do not lie on the front of your body to sleep. WHAT SHOULD I KNOW ABOUT LIFTING, TWISTING, AND REACHING Lifting and Heavy Lifting  Avoid heavy lifting, especially lifting over and over again. If you must do heavy lifting:  Stretch before lifting.  Work slowly.  Rest between lifts.  Use a tool such as a cart or a dolly to move objects if one is available.  Make several small trips instead of carrying one heavy load.  Ask for help when you need it, especially when moving big objects.  Follow these steps when lifting:  Stand with your feet shoulder-width apart.  Get as close to the object as you can. Do not pick up a heavy object that is far from your body.  Use handles or lifting straps if they are available.  Bend at your knees. Squat down, but  keep your heels off the floor.  Keep your shoulders back. Keep your chin tucked in. Keep your back straight.  Lift the object slowly while you tighten the muscles in your legs, belly, and butt. Keep the object as close to the center of your body as possible.  Follow these steps when putting down a heavy load:  Stand with your feet shoulder-width apart.  Lower the object slowly while you tighten the muscles in your legs, belly, and butt. Keep the object as close to the center of your body as possible.  Keep your shoulders back. Keep your chin tucked in. Keep your back straight.  Bend at your knees. Squat down, but keep your heels off the floor.  Use handles or lifting straps if they are available. Twisting and Reaching 1. Avoid lifting heavy objects above your waist. 2. Do not twist at your waist while you are lifting or carrying a load. If you need to turn, move your feet. 3. Do not bend over without bending at your knees. 4. Avoid reaching over your head, across a table, or for an  object on a high surface.  WHAT ARE SOME OTHER TIPS? 1. Avoid wet floors and icy ground. Keep sidewalks clear of ice to prevent falls.  2. Do not sleep on a mattress that is too soft or too hard.  3. Keep items that you use often within easy reach.  4. Put heavier objects on shelves at waist level, and put lighter objects on lower or higher shelves. 5. Find ways to lower your stress, such as: 1. Exercise. 2. Massage. 3. Relaxation techniques. 6. Talk with your doctor if you feel anxious or depressed. These conditions can make back pain worse. 7. Wear flat heel shoes with cushioned soles. 8. Avoid making quick (sudden) movements. 9. Use both shoulder straps when carrying a backpack. 10. Do not use any tobacco products, including cigarettes, chewing tobacco, or electronic cigarettes. If you need help quitting, ask your doctor.   This information is not intended to replace advice given to you by your health care provider. Make sure you discuss any questions you have with your health care provider.   Document Released: 05/01/2008 Document Revised: 03/30/2015 Document Reviewed: 11/17/2014 Elsevier Interactive Patient Education 2016 [place].  Back Exercises If you have pain in your back, do these exercises 2-3 times each day or as told by your doctor. When the pain goes away, do the exercises once each day, but repeat the steps more times for each exercise (do more repetitions). If you do not have pain in your back, do these exercises once each day or as told by your doctor. EXERCISES Single Knee to Chest Do these steps 3-5 times in a row for each leg:  Lie on your back on a firm bed or the floor with your legs stretched out.  Bring one knee to your chest.  Hold your knee to your chest by grabbing your knee or thigh.  Pull on your knee until you feel a gentle stretch in your lower back.  Keep doing the stretch for 10-30 seconds.  Slowly let go of your leg and straighten  it. Pelvic Tilt Do these steps 5-10 times in a row:  Lie on your back on a firm bed or the floor with your legs stretched out.  Bend your knees so they point up to the ceiling. Your feet should be flat on the floor.  Tighten your lower belly (abdomen) muscles to press your lower  back against the floor. This will make your tailbone point up to the ceiling instead of pointing down to your feet or the floor.  Stay in this position for 5-10 seconds while you gently tighten your muscles and breathe evenly. Cat-Cow Do these steps until your lower back bends more easily:  Get on your hands and knees on a firm surface. Keep your hands under your shoulders, and keep your knees under your hips. You may put padding under your knees.  Let your head hang down, and make your tailbone point down to the floor so your lower back is round like the back of a cat.  Stay in this position for 5 seconds.  Slowly lift your head and make your tailbone point up to the ceiling so your back hangs low (sags) like the back of a cow.  Stay in this position for 5 seconds. Press-Ups Do these steps 5-10 times in a row:  Lie on your belly (face-down) on the floor.  Place your hands near your head, about shoulder-width apart.  While you keep your back relaxed and keep your hips on the floor, slowly straighten your arms to raise the top half of your body and lift your shoulders. Do not use your back muscles. To make yourself more comfortable, you may change where you place your hands.  Stay in this position for 5 seconds.  Slowly return to lying flat on the floor. Bridges Do these steps 10 times in a row: 5. Lie on your back on a firm surface. 6. Bend your knees so they point up to the ceiling. Your feet should be flat on the floor. 7. Tighten your butt muscles and lift your butt off of the floor until your waist is almost as high as your knees. If you do not feel the muscles working in your butt and the back of  your thighs, slide your feet 1-2 inches farther away from your butt. 8. Stay in this position for 3-5 seconds. 9. Slowly lower your butt to the floor, and let your butt muscles relax. If this exercise is too easy, try doing it with your arms crossed over your chest. Belly Crunches Do these steps 5-10 times in a row: 11. Lie on your back on a firm bed or the floor with your legs stretched out. 12. Bend your knees so they point up to the ceiling. Your feet should be flat on the floor. 54. Cross your arms over your chest. 14. Tip your chin a little bit toward your chest but do not bend your neck. 16. Tighten your belly muscles and slowly raise your chest just enough to lift your shoulder blades a tiny bit off of the floor. 16. Slowly lower your chest and your head to the floor. Back Lifts Do these steps 5-10 times in a row: 1. Lie on your belly (face-down) with your arms at your sides, and rest your forehead on the floor. 2. Tighten the muscles in your legs and your butt. 3. Slowly lift your chest off of the floor while you keep your hips on the floor. Keep the back of your head in line with the curve in your back. Look at the floor while you do this. 4. Stay in this position for 3-5 seconds. 5. Slowly lower your chest and your face to the floor. GET HELP IF:  Your back pain gets a lot worse when you do an exercise.  Your back pain does not lessen 2 hours after you  exercise. If you have any of these problems, stop doing the exercises. Do not do them again unless your doctor says it is okay. GET HELP RIGHT AWAY IF:  You have sudden, very bad back pain. If this happens, stop doing the exercises. Do not do them again unless your doctor says it is okay.   This information is not intended to replace advice given to you by your health care provider. Make sure you discuss any questions you have with your health care provider.   Document Released: 12/16/2010 Document Revised: 08/04/2015 Document  Reviewed: 01/07/2015 Elsevier Interactive Patient Education 2016 [employer]  Muscle Cramps and Spasms Muscle cramps and spasms are when muscles tighten by themselves. They usually get better within minutes. Muscle cramps are painful. They are usually stronger and last longer than muscle spasms. Muscle spasms may or may not be painful. They can last a few seconds or much longer. HOME CARE  Drink enough fluid to keep your pee (urine) clear or pale yellow.  Massage, stretch, and relax the muscle.  Use a warm towel, heating pad, or warm shower water on tight muscles.  Place ice on the muscle if it is tender or in pain.  Put ice in a plastic bag.  Place a towel between your skin and the bag.  Leave the ice on for 15-20 minutes, 03-04 times a day.  Only take medicine as told by your doctor. GET HELP RIGHT AWAY IF:  Your cramps or spasms get worse, happen more often, or do not get better with time. MAKE SURE YOU:  Understand these instructions.  Will watch your condition.  Will get help right away if you are not doing well or get worse.   This information is not intended to replace advice given to you by your health care provider. Make sure you discuss any questions you have with your health care provider.   Document Released: 10/26/2008 Document Revised: 03/10/2013 Document Reviewed: 10/30/2012 Elsevier Interactive Patient Education 2016 [place] therapy can help ease sore, stiff, injured, and tight muscles and joints. Heat relaxes your muscles, which may help ease your pain. Heat therapy should only be used on old, pre-existing, or long-lasting (chronic) injuries. Do not use heat therapy unless told by your doctor. HOW TO USE HEAT THERAPY There are several different kinds of heat therapy, including:  Moist heat pack.  Warm water bath.  Hot water bottle.  Electric heating pad.  Heated gel pack.  Heated wrap.  Electric heating pad. GENERAL  HEAT THERAPY RECOMMENDATIONS   Do not sleep while using heat therapy. Only use heat therapy while you are awake.  Your skin may turn pink while using heat therapy. Do not use heat therapy if your skin turns red.  Do not use heat therapy if you have new pain.  High heat or long exposure to heat can cause burns. Be careful when using heat therapy to avoid burning your skin.  Do not use heat therapy on areas of your skin that are already irritated, such as with a rash or sunburn. GET HELP IF:   You have blisters, redness, swelling (puffiness), or numbness.  You have new pain.  Your pain is worse. MAKE SURE YOU:  Understand these instructions.  Will watch your condition.  Will get help right away if you are not doing well or get worse.   This information is not intended to replace advice given to you by your health care provider. Make sure  you discuss any questions you have with your health care provider.   Document Released: 02/05/2012 Document Revised: 12/04/2014 Document Reviewed: 01/06/2014 Elsevier Interactive Patient Education [employer].

## 2016-06-04 NOTE — ED Notes (Addendum)
Patient here with lower back pain for 5 days, hx of chronic back pain, denies new injury. No neuro deficits, NAD

## 2016-06-12 ENCOUNTER — Ambulatory Visit (HOSPITAL_COMMUNITY)
Admission: EM | Admit: 2016-06-12 | Discharge: 2016-06-12 | Disposition: A | Discharge status: Home / Self Care | Payer: Self-pay | Attending: Family Medicine | Admitting: Family Medicine

## 2016-06-12 ENCOUNTER — Encounter (HOSPITAL_COMMUNITY): Payer: Self-pay | Admitting: *Deleted

## 2016-06-12 DIAGNOSIS — M545 Low back pain: Secondary | ICD-10-CM

## 2016-06-12 MED ORDER — HYDROCODONE-ACETAMINOPHEN 10-325 MG PO TABS: 1.0000 | ORAL_TABLET | Freq: Three times a day (TID) | ORAL | Status: DC

## 2016-06-12 NOTE — Discharge Instructions (Signed)

## 2016-06-12 NOTE — ED Notes (Signed)
Patient reports history of chronic lower back pain, states has appointment in 2 weeks for DDD, sciatica, and curved spine but is running out of meds. States she still has some felxeril but does not have vidocin 10-325. Patient also has a wart that she would like looked at on right middle finger.

## 2016-06-12 NOTE — ED Provider Notes (Signed)
CSN: [id number]     Arrival date & time 06/12/16  1105 History   First MD Initiated Contact with Patient 06/12/16 1217     Chief Complaint  Patient presents with  . Back Pain   (Consider location/radiation/quality/duration/timing/severity/associated sxs/prior Treatment) HPI History obtained from patient:  Pt presents with the cc of:  Low back pain Duration of symptoms: Ongoing Treatment prior to arrival: Hydrocodone and Flexeril Context: Patient states that she is currently out of her medications. She has an appointment at [facility] next week. He states that it is her chronic pain and there is no but no change in her pain. Other symptoms include: None Pain score: 5 FAMILY HISTORY: Hypertension    Past Medical History  Diagnosis Date  . DDD (degenerative disc disease), lumbar   . Scoliosis   . Sciatica    Past Surgical History  Procedure Laterality Date  . Cholecystectomy     History reviewed. No pertinent family history. Social History  Substance Use Topics  . Smoking status: Current Some Day Smoker    Types: Cigarettes  . Smokeless tobacco: Never Used  . Alcohol Use: No   OB History    No data available     Review of Systems  Denies: HEADACHE, NAUSEA, ABDOMINAL PAIN, CHEST PAIN, CONGESTION, DYSURIA, SHORTNESS OF BREATH  Allergies  Review of patient's allergies indicates no known allergies.  Home Medications   Prior to Admission medications   Medication Sig Start Date End Date Taking? Authorizing Provider  HYDROcodone-acetaminophen (NORCO) 10-325 MG tablet Take 1 tablet by mouth every 6 (six) hours as needed. 05/23/16  Yes Tharon Aquas, PA  meloxicam (MOBIC) 7.5 MG tablet Take 1 tablet (7.5 mg total) by mouth daily. 05/31/16  Yes Cheri Fowler, PA-C  HYDROcodone-acetaminophen (NORCO/VICODIN) 5-325 MG tablet Take 1 tablet by mouth every 6 (six) hours as needed for severe pain. 05/21/16   Audry Pili, PA-C  predniSONE (DELTASONE) 20 MG tablet Take 3  tablets (60 mg total) by mouth daily with breakfast. 05/21/16   Audry Pili, PA-C  traMADol (ULTRAM) 50 MG tablet Take 1 tablet (50 mg total) by mouth every 6 (six) hours as needed. 05/31/16   Cheri Fowler, PA-C   Meds Ordered and Administered this Visit  Medications - No data to display  BP 136/78 mmHg  Pulse 87  Temp(Src) 97.6 F (36.4 C) (Oral)  Resp 12  SpO2 100%  LMP 06/02/2016 No data found.   Physical Exam NURSES NOTES AND VITAL SIGNS REVIEWED. CONSTITUTIONAL: Well developed, well nourished, no acute distress HEENT: normocephalic, atraumatic EYES: Conjunctiva normal NECK:normal ROM, supple, no adenopathy PULMONARY:No respiratory distress, normal effort ABDOMINAL: Soft, ND, NT BS+, No CVAT MUSCULOSKELETAL: Normal ROM of all extremities, there is some subjective tenderness to the lumbar spine both sides. No palpable spasm. SKIN: warm and dry without rash PSYCHIATRIC: Mood and affect, behavior are normal  ED Course  Procedures (including critical care time)  Labs Review Labs Reviewed - No data to display  Imaging Review No results found.   Visual Acuity Review  Right Eye Distance:   Left Eye Distance:   Bilateral Distance:    Right Eye Near:   Left Eye Near:    Bilateral Near:       Patient is advised that with [place] prescription monitoring system was accessed and reviewed her prescriptions that she is receiving over the last couple months. She is advised that she was given hydrocodone on July 9 and she will need to  follow-up with her primary care provider for additional medication.  MDM   1. Low back pain without sciatica, unspecified back pain laterality     Patient is reassured that there are no issues that require transfer to higher level of care at this time or additional tests. Patient is advised to continue home symptomatic treatment. Patient is advised that if there are new or worsening symptoms to attend the emergency department, contact primary care  provider, or return to [facility]. Instructions of care provided discharged home in stable condition.    THIS NOTE WAS GENERATED USING A VOICE RECOGNITION SOFTWARE PROGRAM. ALL REASONABLE EFFORTS  WERE MADE TO PROOFREAD THIS DOCUMENT FOR ACCURACY.  I have verbally reviewed the discharge instructions with the patient. A printed AVS was given to the patient.  All questions were answered prior to discharge.      Tharon AquasFrank C Lydell Moga, PA 06/12/16 2047

## 2016-07-29 ENCOUNTER — Ambulatory Visit (INDEPENDENT_AMBULATORY_CARE_PROVIDER_SITE_OTHER): Payer: Self-pay | Admitting: Urgent Care

## 2016-07-29 VITALS — BP 140/80 | HR 82 | Temp 98.0°F | Resp 18 | Ht 66.0 in | Wt 230.4 lb

## 2016-07-29 DIAGNOSIS — M419 Scoliosis, unspecified: Secondary | ICD-10-CM

## 2016-07-29 DIAGNOSIS — G8929 Other chronic pain: Secondary | ICD-10-CM

## 2016-07-29 DIAGNOSIS — M549 Dorsalgia, unspecified: Secondary | ICD-10-CM

## 2016-07-29 DIAGNOSIS — M5137 Other intervertebral disc degeneration, lumbosacral region: Secondary | ICD-10-CM

## 2016-07-29 MED ORDER — HYDROCODONE-ACETAMINOPHEN 5-325 MG PO TABS: 1.0000 | ORAL_TABLET | Freq: Four times a day (QID) | ORAL | 0 refills | Status: DC | PRN

## 2016-07-29 MED ORDER — PREDNISONE 20 MG PO TABS: 40.0000 mg | ORAL_TABLET | Freq: Every day | ORAL | 0 refills | Status: DC

## 2016-07-29 MED ORDER — DICLOFENAC SODIUM 3 % TD GEL: 1.0000 "application " | Freq: Two times a day (BID) | TRANSDERMAL | 5 refills | Status: DC

## 2016-07-29 NOTE — Progress Notes (Signed)
    MRN: [id number] DOB: 11/13/1976  Subjective:   Madison Blackburn is a 40 y.o. female presenting for chief complaint of Establish Care and Medication Refill (Norco)  Patient has scoliosis, DDD of her back, has chronic back pain. Diagnosis was made 5 years ago and has been steadily worsening. She also has left hip pain that radiates into her left thigh, has been told she has sciatica. She did take a steroid course for this 2 months ago. For her back pain patient is currently managed with Norco 5-325. She averages 2-3 pills per day. She has tried ibuprofen and Alleve as well but feels GI upset with Alleve. 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 Has never been seen by Neurosurgeon, back surgeon. [place] and is trying to obtain her medical records. Denies trauma, falls, numbness or tingling, weakness, saddle paraesthesias.  Madison Blackburn has a current medication list which includes the following prescription(s): hydrocodone-acetaminophen. Also has No Known Allergies.  Madison Blackburn  has a past medical history of DDD (degenerative disc disease), lumbar; Sciatica; and Scoliosis. Also  has a past surgical history that includes Cholecystectomy.  Objective:   Vitals: BP 140/80   Pulse 82   Temp 98 F (36.7 C) (Oral)   Resp 18   Ht 5\' 6"  (1.676 m)   Wt 230 lb 6 oz (104.5 kg)   LMP 06/28/2016 (Approximate)   SpO2 100%   BMI 37.18 kg/m   Physical Exam  Constitutional: She is oriented to person, place, and time. She appears well-developed and well-nourished.  Cardiovascular: Normal rate.   Pulmonary/Chest: Effort normal.  Musculoskeletal:       Lumbar back: She exhibits decreased range of motion (flexion, extension) and tenderness (over areas depicted). She exhibits no bony tenderness, no swelling, no edema, no deformity, no laceration and no spasm.       Back:  Negative SLR.  Neurological: She is alert and oriented to person, place, and time. She  has normal reflexes. Coordination normal.  Skin: Skin is warm and dry.   Assessment and Plan :   1. DDD (degenerative disc disease), lumbosacral 2. Scoliosis 3. Chronic back pain - Deferred x-rays due to cost burden, self-pay patient. I counseled on management of her chronic back pain and need for pain clinic. She also needs a consult with neurosurgery. She requested we hold off on this until she obtain insurance in the next month. I let patient know that I will not be filling her opioid pain medication regularly, I am willing to do 1 more refill while she obtains insurance. She verbalized understanding.   Wallis BambergMario Param Capri, PA-C [facility] [phone] 07/29/2016 10:28 AM

## 2016-07-29 NOTE — Patient Instructions (Addendum)
Tylenol You may take 500mg  every 6 hours or 750mg  every 8 hours for pain and inflammation associated with degenerative disc disease, scoliosis. You can try alternating with ibuprofen 600mg  with food.     Degenerative Disk Disease Degenerative disk disease is a condition caused by the changes that occur in spinal disks as you grow older. Spinal disks are soft and compressible disks located between the bones of your spine (vertebrae). These disks act like shock absorbers. Degenerative disk disease can affect the whole spine. However, the neck and lower back are most commonly affected. Many changes can occur in the spinal disks with aging, such as:  The spinal disks may dry and shrink.  Small tears may occur in the tough, outer covering of the disk (annulus).  The disk space may become smaller due to loss of water.  Abnormal growths in the bone (spurs) may occur. This can put pressure on the nerve roots exiting the spinal canal, causing pain.  The spinal canal may become narrowed. RISK FACTORS   Being overweight.  Having a family history of degenerative disk disease.  Smoking.  There is increased risk if you are doing heavy lifting or have a sudden injury. SIGNS AND SYMPTOMS  Symptoms vary from person to person and may include:  Pain that varies in intensity. Some people have no pain, while others have severe pain. The location of the pain depends on the part of your backbone that is affected.  You will have neck or arm pain if a disk in the neck area is affected.  You will have pain in your back, buttocks, or legs if a disk in the lower back is affected.  Pain that becomes worse while bending, reaching up, or with twisting movements.  Pain that may start gradually and then get worse as time passes. It may also start after a major or minor injury.  Numbness or tingling in the arms or legs. DIAGNOSIS  Your health care provider will ask you about your symptoms and about activities  or habits that may cause the pain. He or she may also ask about any injuries, diseases, or treatments you have had. Your health care provider will examine you to check for the range of movement that is possible in the affected area, to check for strength in your extremities, and to check for sensation in the areas of the arms and legs supplied by different nerve roots. You may also have:   An X-ray of the spine.  Other imaging tests, such as MRI. TREATMENT  Your health care provider will advise you on the best plan for treatment. Treatment may include:  Medicines.  Rehabilitation exercises. HOME CARE INSTRUCTIONS   Follow proper lifting and walking techniques as advised by your health care provider.  Maintain good posture.  Exercise regularly as advised by your health care provider.  Perform relaxation exercises.  Change your sitting, standing, and sleeping habits as advised by your health care provider.  Change positions frequently.  Lose weight or maintain a healthy weight as advised by your health care provider.  Do not use any tobacco products, including cigarettes, chewing tobacco, or electronic cigarettes. If you need help quitting, ask your health care provider.  Wear supportive footwear.  Take medicines only as directed by your health care provider. SEEK MEDICAL CARE IF:   Your pain does not go away within 1-4 weeks.  You have significant appetite or weight loss. SEEK IMMEDIATE MEDICAL CARE IF:   Your pain is severe.  You notice weakness in your arms, hands, or legs.  You begin to lose control of your bladder or bowel movements.  You have fevers or night sweats. MAKE SURE YOU:   Understand these instructions.  Will watch your condition.  Will get help right away if you are not doing well or get worse.   This information is not intended to replace advice given to you by your health care provider. Make sure you discuss any questions you have with your  health care provider.   Document Released: 09/10/2007 Document Revised: 12/04/2014 Document Reviewed: 03/17/2014 Elsevier Interactive Patient Education 2016 Elsevier Inc.    Scoliosis Scoliosis is the name given to a spine that curves sideways.Scoliosis can cause twisting of your shoulders, hips, chest, back, and rib cage.  CAUSES  The cause of scoliosis is not always known. It may be caused by a birth defect or by a disease that can cause muscular dysfunction and imbalance, such as cerebral palsy and muscular dystrophy.  RISK FACTORS Having a disease that causes muscle disease or dysfunction. SIGNS AND SYMPTOMS Scoliosis often has no signs or symptoms.If they are present, they may include:  Unequal size of one body side compared to the other (asymmetry).  Visible curvature of the spine.  Pain. The pain may limit physical activity.  Shortness of breath.  Bowel or bladder issues. DIAGNOSIS A skilled health care provider will perform an evaluation. This will involve:  Taking your history.  Performing a physical examination.  Performing a neurological exam to detect nerve or muscle function loss.  Range of motion studies on the spine.  X-rays. An MRI may also be obtained. TREATMENT  Treatment varies depending on the nature, extent, and severity of the disease. If the curvature is not great, you may need only observation. A brace may be used to prevent scoliosis from progressing. A brace may also be needed during growth spurts. Physical therapy may be of benefit. Surgery may be required.  HOME CARE INSTRUCTIONS   Your health care provider may suggest exercises to strengthen your muscles. Perform them as directed.  Ask your health care provider before participating in any sports.   If you have been prescribed an orthopedic brace, wear it as instructed by your health care provider. SEEK MEDICAL CARE IF: Your brace causes the skin to become sore (chafe) or is  uncomfortable.  SEEK IMMEDIATE MEDICAL CARE IF:  You have back pain that is not relieved by the medicines prescribed by your health care provider.   Your legs feel weak or you lose function in your legs.  You lose some bowel or bladder control.    This information is not intended to replace advice given to you by your health care provider. Make sure you discuss any questions you have with your health care provider.   Document Released: 11/10/2000 Document Revised: 11/18/2013 Document Reviewed: 07/21/2013 Elsevier Interactive Patient Education 2016 [employer].     IF you received an x-ray today, you will receive an invoice from [facility]. Please contact [facility] at [phone] with questions or concerns regarding your invoice.   IF you received labwork today, you will receive an invoice from [employer]. Please contact [facility] at [phone] with questions or concerns regarding your invoice.   Our billing staff will not be able to assist you with questions regarding bills from these companies.  You will be contacted with the lab results as soon as they are available. The fastest way to get your  results is to activate your My Chart account. Instructions are located on the last page of this paperwork. If you have not heard from [place] regarding the results in 2 weeks, please contact this office.

## 2016-08-14 ENCOUNTER — Other Ambulatory Visit: Payer: Self-pay

## 2016-08-14 DIAGNOSIS — M5137 Other intervertebral disc degeneration, lumbosacral region: Secondary | ICD-10-CM

## 2016-08-14 DIAGNOSIS — G8929 Other chronic pain: Secondary | ICD-10-CM

## 2016-08-14 DIAGNOSIS — M549 Dorsalgia, unspecified: Secondary | ICD-10-CM

## 2016-08-14 DIAGNOSIS — M419 Scoliosis, unspecified: Secondary | ICD-10-CM

## 2016-08-14 NOTE — Telephone Encounter (Signed)
Patient is calling to request a refill for hydrocodone. Please call when ready for pick up!  [phone]

## 2016-08-16 NOTE — Telephone Encounter (Signed)
I left a voice message for patient. At our previous office visit I discussed at length with patient that I do not do chronic pain management. I offered patient 1 refill outside of her office visit for opioid pain medication and she has already gotten that. Patient did not have any x-rays performed due to cost burden. She was supposed to bring in copies of her own images but I do not see that this has been done. She really needs to obtain insurance as we discussed so that she can be managed by neurosurgery and/or chronic pain clinic. I did suggest that she could return to clinic and see a different provider to see if they would be willing to do chronic pain management but I can no longer do this as this is not my specialty.

## 2017-12-31 ENCOUNTER — Other Ambulatory Visit: Payer: Self-pay

## 2017-12-31 ENCOUNTER — Ambulatory Visit (HOSPITAL_COMMUNITY)
Admission: EM | Admit: 2017-12-31 | Discharge: 2017-12-31 | Disposition: A | Discharge status: Home / Self Care | Payer: Medicaid - Out of State | Attending: Family Medicine | Admitting: Family Medicine

## 2017-12-31 ENCOUNTER — Encounter (HOSPITAL_COMMUNITY): Payer: Self-pay | Admitting: Emergency Medicine

## 2017-12-31 DIAGNOSIS — M5442 Lumbago with sciatica, left side: Secondary | ICD-10-CM

## 2017-12-31 DIAGNOSIS — G8929 Other chronic pain: Secondary | ICD-10-CM

## 2017-12-31 NOTE — ED Notes (Signed)
Per provider patient left after discussing treatment plan

## 2017-12-31 NOTE — ED Provider Notes (Signed)
[facility]    CSN: [id number] Arrival date & time: 12/31/17  1605     History   Chief Complaint Chief Complaint  Patient presents with  . Back Pain    HPI Madison Blackburn is a 42 y.o. female.   Madison Blackburn presents with complaints of low back pain which has been chronic in nature for many years. She states she has a history of DDD, scoliosis and sciatica. 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 The pain to her back has flared over the past two days. Rates it 10/10. No known exacerbating factors. She states she typically uses diclofenac cream which she ran out of, and also ran out of norco which she uses daily. She states she does have a PCP. Has had extensive workup in the past for this she explains, including mri. Without numbness, tingling, weakness, stool or urine incontinence. Pain radiates to left buttock.      ROS per HPI.       Past Medical History:  Diagnosis Date  . DDD (degenerative disc disease), lumbar   . Sciatica   . Scoliosis     There are no active problems to display for this patient.   Past Surgical History:  Procedure Laterality Date  . CHOLECYSTECTOMY      OB History    No data available       Home Medications    Prior to Admission medications   Medication Sig Start Date End Date Taking? Authorizing Provider  Diclofenac Sodium 3 % GEL Place 1 application onto the skin 2 (two) times daily. Patient not taking: Reported on 12/31/2017 07/29/16   Wallis BambergMani, Mario, PA-C  HYDROcodone-acetaminophen (NORCO/VICODIN) 5-325 MG tablet Take 1 tablet by mouth every 6 (six) hours as needed for severe pain. Patient not taking: Reported on 12/31/2017 07/29/16   Wallis BambergMani, Mario, PA-C  predniSONE (DELTASONE) 20 MG tablet Take 2 tablets (40 mg total) by mouth daily with breakfast. Patient not taking: Reported on 12/31/2017 07/29/16   Wallis BambergMani, Mario, PA-C    Family History No family history on file.  Social History Social History   Tobacco Use  . Smoking status:  Current Some Day Smoker    Types: Cigarettes  . Smokeless tobacco: Never Used  Substance Use Topics  . Alcohol use: No  . Drug use: No     Allergies   Patient has no known allergies.   Review of Systems Review of Systems   Physical Exam Triage Vital Signs ED Triage Vitals  Enc Vitals Group     BP 12/31/17 1721 (!) 161/96     Pulse Rate 12/31/17 1721 83     Resp 12/31/17 1721 18     Temp 12/31/17 1721 98.5 F (36.9 C)     Temp Source 12/31/17 1721 Oral     SpO2 12/31/17 1721 99 %     Weight --      Height --      Head Circumference --      Peak Flow --      Pain Score 12/31/17 1727 10     Pain Loc --      Pain Edu? --      Excl. in GC? --    No data found.  Updated Vital Signs BP (!) 161/96 (BP Location: Right Arm) Comment: Notified Melissa  Pulse 83   Temp 98.5 F (36.9 C) (Oral)   Resp 18   LMP 11/28/2017   SpO2 99%   Visual Acuity Right Eye  Distance:   Left Eye Distance:   Bilateral Distance:    Right Eye Near:   Left Eye Near:    Bilateral Near:     Physical Exam  Constitutional: She is oriented to person, place, and time. She appears well-developed and well-nourished. No distress.  Cardiovascular: Normal rate, regular rhythm and normal heart sounds.  Pulmonary/Chest: Effort normal and breath sounds normal.  Musculoskeletal:       Lumbar back: She exhibits tenderness and spasm.       Back:  Pain with bilateral straight leg raise; noted discomfort with transition from sitting to standing; tolerates heel and toe touch weight bearing; sensation intact to distal legs; strength equal to lower extremities  Neurological: She is alert and oriented to person, place, and time.  Skin: Skin is warm and dry.     UC Treatments / Results  Labs (all labs ordered are listed, but only abnormal results are displayed) Labs Reviewed - No data to display  EKG  EKG Interpretation None       Radiology No results found.  Procedures Procedures  (including critical care time)  Medications Ordered in UC Medications - No data to display   Initial Impression / Assessment and Plan / UC Course  I have reviewed the triage vital signs and the nursing notes.  Pertinent labs & imaging results that were available during my care of the patient were reviewed by me and considered in my medical decision making (see chart for details).     Patient with chronic back pain and chronic use of hydrocodone. Discussed that this needs to be managed long term with PCP and/or pain specialist. Offered toradol shot as patient cannot tolerate NSAID GI side effects; offered steroid pack. Patient upset that hydrocodone will not be refilled from urgent care, requests physician opinion.  Dr. Milus Glazier visited with patient and explained policy of urgent care and chronic narcotic use, patient used much profanity and exited clinic without receiving AVS. Ambulatory out of clinic without difficulty.    Final Clinical Impressions(s) / UC Diagnoses   Final diagnoses:  Chronic bilateral low back pain with left-sided sciatica    ED Discharge Orders    None       Controlled Substance Prescriptions [place] Controlled Substance Registry consulted? Not Applicable   Georgetta Haber, NP 12/31/17 [phone]

## 2017-12-31 NOTE — ED Triage Notes (Signed)
Pt states "I have back problems and I dont have any medications for it" pt has scoliosis, arthritis, and DDD.

## 2018-01-01 ENCOUNTER — Ambulatory Visit: Payer: Self-pay | Admitting: Urgent Care

## 2018-01-08 ENCOUNTER — Encounter: Payer: Self-pay | Admitting: Urgent Care

## 2018-01-08 ENCOUNTER — Other Ambulatory Visit: Payer: Self-pay

## 2018-01-08 ENCOUNTER — Ambulatory Visit (INDEPENDENT_AMBULATORY_CARE_PROVIDER_SITE_OTHER): Payer: Self-pay | Admitting: Urgent Care

## 2018-01-08 VITALS — BP 124/82 | HR 86 | Temp 98.0°F | Resp 16 | Ht 66.0 in | Wt 207.0 lb

## 2018-01-08 DIAGNOSIS — M549 Dorsalgia, unspecified: Secondary | ICD-10-CM

## 2018-01-08 DIAGNOSIS — M5137 Other intervertebral disc degeneration, lumbosacral region: Secondary | ICD-10-CM

## 2018-01-08 DIAGNOSIS — G8929 Other chronic pain: Secondary | ICD-10-CM

## 2018-01-08 DIAGNOSIS — M419 Scoliosis, unspecified: Secondary | ICD-10-CM

## 2018-01-08 MED ORDER — CELECOXIB 200 MG PO CAPS: 200.0000 mg | ORAL_CAPSULE | Freq: Every day | ORAL | 1 refills | Status: DC

## 2018-01-08 NOTE — Patient Instructions (Signed)
Celecoxib capsules  What is this medicine?  CELECOXIB (sell a KOX ib) is a non-steroidal anti-inflammatory drug (NSAID). This medicine is used to treat arthritis and ankylosing spondylitis. It may be also used for pain or painful monthly periods.  This medicine may be used for other purposes; ask your health care provider or pharmacist if you have questions.  COMMON BRAND NAME(S): Celebrex  What should I tell my health care provider before I take this medicine?  They need to know if you have any of these conditions:  -asthma  -coronary artery bypass graft (CABG) surgery within the past 2 weeks  -drink more than 3 alcohol-containing drinks a day  -heart disease or circulation problems like heart failure or leg edema (fluid retention)  -high blood pressure  -kidney disease  -liver disease  -stomach bleeding or ulcers  -an unusual or allergic reaction to celecoxib, sulfa drugs, aspirin, other NSAIDs, other medicines, foods, dyes, or preservatives  -pregnant or trying to get pregnant  -breast-feeding  How should I use this medicine?  Take this medicine by mouth with a full glass of water. Follow the directions on the prescription label. Take it with food if it upsets your stomach or if you take 400 mg at one time. Try to not lie down for at least 10 minutes after you take the medicine. Take the medicine at the same time each day. Do not take more medicine than you are told to take. Long-term, continuous use may increase the risk of heart attack or stroke.  A special MedGuide will be given to you by the pharmacist with each prescription and refill. Be sure to read this information carefully each time.  Talk to your pediatrician regarding the use of this medicine in children. Special care may be needed.  Overdosage: If you think you have taken too much of this medicine contact a poison control center or emergency room at once.  NOTE: This medicine is only for you. Do not share this medicine with others.  What if I miss  a dose?  If you miss a dose, take it as soon as you can. If it is almost time for your next dose, take only that dose. Do not take double or extra doses.  What may interact with this medicine?  Do not take this medicine with any of the following medications:  -cidofovir  -methotrexate  -other NSAIDs, medicines for pain and inflammation, like ibuprofen or naproxen  -pemetrexed  This medicine may also interact with the following medications:  -alcohol  -aspirin and aspirin-like drugs  -diuretics  -fluconazole  -lithium  -medicines for high blood pressure  -steroid medicines like prednisone or cortisone  -warfarin  This list may not describe all possible interactions. Give your health care provider a list of all the medicines, herbs, non-prescription drugs, or dietary supplements you use. Also tell them if you smoke, drink alcohol, or use illegal drugs. Some items may interact with your medicine.  What should I watch for while using this medicine?  Tell your doctor or health care professional if your pain does not get better. Talk to your doctor before taking another medicine for pain. Do not treat yourself.  This medicine does not prevent heart attack or stroke. In fact, this medicine may increase the chance of a heart attack or stroke. The chance may increase with longer use of this medicine and in people who have heart disease. If you take aspirin to prevent heart attack or stroke, talk with your   medicine. This medicine can cause ulcers and bleeding in the stomach and intestines at any time during treatment. Ulcers and bleeding can happen without warning symptoms and can cause death. What side effects may I notice from receiving this medicine? Side  effects that you should report to your doctor or health care professional as soon as possible: -allergic reactions like skin rash, itching or hives, swelling of the face, lips, or tongue -black or bloody stools, blood in the urine or vomit -blurred vision -breathing problems -chest pain -nausea, vomiting -problems with balance, talking, walking -redness, blistering, peeling or loosening of the skin, including inside the mouth -unexplained weight gain or swelling -unusually weak or tired -yellowing of eyes, skin Side effects that usually do not require medical attention (report to your doctor or health care professional if they continue or are bothersome): -constipation or diarrhea -dizziness -gas or heartburn -upset stomach This list may not describe all possible side effects. Call your doctor for medical advice about side effects. You may report side effects to FDA at 1-800-FDA-1088. Where should I keep my medicine? Keep out of the reach of children. Store at room temperature between 15 and 30 degrees C (59 and 86 degrees F). Keep container tightly closed. Throw away any unused medicine after the expiration date. NOTE: This sheet is a summary. It may not cover all possible information. If you have questions about this medicine, talk to your doctor, pharmacist, or health care provider.  2018 Elsevier/Gold Standard (2010-01-12 10:54:17)     Chronic Pain, Adult Chronic pain is a type of pain that lasts or keeps coming back (recurs) for at least six months. You may have chronic headaches, abdominal pain, or body pain. Chronic pain may be related to an illness, such as fibromyalgia or complex regional pain syndrome. Sometimes the cause of chronic pain is not known. Chronic pain can make it hard for you to do daily activities. If not treated, chronic pain can lead to other health problems, including anxiety and depression. Treatment depends on the cause and severity of your pain. You may  need to work with a pain specialist to come up with a treatment plan. The plan may include medicine, counseling, and physical therapy. Many people benefit from a combination of two or more types of treatment to control their pain. Follow these instructions at home: Lifestyle  Consider keeping a pain diary to share with your health care providers.  Consider talking with a mental health care provider (psychologist) about how to cope with chronic pain.  Consider joining a chronic pain support group.  Try to control or lower your stress levels. Talk to your health care provider about strategies to do this. General instructions   Take over-the-counter and prescription medicines only as told by your health care provider.  Follow your treatment plan as told by your health care provider. This may include: ? Gentle, regular exercise. ? Eating a healthy diet that includes foods such as vegetables, fruits, fish, and lean meats. ? Cognitive or behavioral therapy. ? Working with a Adult nurse. ? Meditation or yoga. ? Acupuncture or massage therapy. ? Aroma, color, light, or sound therapy. ? Local electrical stimulation. ? Shots (injections) of numbing or pain-relieving medicines into the spine or the area of pain.  Check your pain level as told by your health care provider. Ask your health care provider if you should use a pain scale.  Learn as much as you can about how to manage your chronic pain. Ask your  health care provider if an intensive pain rehabilitation program or a chronic pain specialist would be helpful.  Keep all follow-up visits as told by your health care provider. This is important. Contact a health care provider if:  Your pain gets worse.  You have new pain.  You have trouble sleeping.  You have trouble doing your normal activities.  Your pain is not controlled with treatment.  Your have side effects from pain medicine.  You feel weak. Get help right away  if:  You lose feeling or have numbness in your body.  You lose control of bowel or bladder function.  Your pain suddenly gets much worse.  You develop shaking or chills.  You develop confusion.  You develop chest pain.  You have trouble breathing or shortness of breath.  You pass out.  You have thoughts about hurting yourself or others. This information is not intended to replace advice given to you by your health care provider. Make sure you discuss any questions you have with your health care provider. Document Released: 08/05/2002 Document Revised: 07/13/2016 Document Reviewed: 05/02/2016 Elsevier Interactive Patient Education  [employer]2018 Elsevier Inc.

## 2018-01-08 NOTE — Progress Notes (Signed)
  MRN: [id number] DOB: 08/11/1976  Subjective:   Madison Blackburn is a 42 y.o. female presenting for refills of Vicodin. At her last visit, 07/29/2016, patient came in for the same. I counseled her then about risks of opioid use without data to support this. I refilled her medication out of courtesy and advised her to look for chronic pain management, referral to neurosurgeon. I gave her 1 more refill thereafter. She reports that [place] was [place] in [place], had full work up including imaging but was never referred to a neurosurgeon for her scoliosis, degenerative disc disease because by her report it was not necessary. She cannot tolerate ibuprofen and other NSAIDs because of nausea, GERD. She was recently seen at [facility] on 12/31/2017, was not prescribed hydrocodone there. Patient became very upset and displayed inappropriate behavior, refer to note for more information. She did obtain a refill for hydrocodone from a clinic in [facility] same day however as shown on the [place] Database.  Madison Blackburn has a current medication list which includes the following prescription(s): diclofenac sodium, hydrocodone-acetaminophen, and prednisone. Also has No Known Allergies.  Madison Blackburn  has a past medical history of DDD (degenerative disc disease), lumbar, Sciatica, and Scoliosis. Also  has a past surgical history that includes Cholecystectomy.  Objective:   Vitals: BP 124/82   Pulse 86   Temp 98 F (36.7 C) (Oral)   Resp 16   Ht 5\' 6"  (1.676 m)   Wt 207 lb (93.9 kg)   LMP 11/29/2017   SpO2 100%   BMI 33.41 kg/m   Physical Exam  Constitutional: She is oriented to person, place, and time. She appears well-developed and well-nourished.  Cardiovascular: Normal rate.  Pulmonary/Chest: Effort normal.  Neurological: She is alert and oriented to person, place, and time.   Assessment and Plan :   DDD (degenerative disc disease), lumbosacral - Plan: Ambulatory referral to Neurosurgery  Scoliosis,  unspecified scoliosis type, unspecified spinal region - Plan: Ambulatory referral to Neurosurgery  Chronic back pain, unspecified back location, unspecified back pain laterality - Plan: Ambulatory referral to Neurosurgery  Counseled patient extensively about risks of opioid use. She was very displeased that I was unwilling to refill her pain medications. I offered her Celebrex to address the issue of GI side effects that she experiences with other NSAIDs. She has never tried this medication before but was adamant that hydrocodone is the only thing that works. I looked up and printed a discount coupon for her from [employer] and instructed her to fill it at [facility]. Referral to Neurology is pending. She wished to speak with a physician for a refill of hydrocodone at the end of her visit but I advised her that there were no other appointments available and she could try to reschedule at our front desk. She expressed displeasure so I suggested to speak with our Research officer, political partypractice administrator, Vernona RiegerLaura.   Wallis BambergMario Jostin Rue, PA-C [facility] at [facility] [phone] 01/08/2018  5:09 PM

## 2018-02-05 ENCOUNTER — Ambulatory Visit: Payer: Self-pay | Admitting: Family Medicine

## 2018-05-16 ENCOUNTER — Telehealth: Payer: Self-pay | Admitting: Urgent Care

## 2018-05-16 NOTE — Telephone Encounter (Signed)
[facility] has called pt several times with no return calls.  This referral is now inactive

## 2018-12-04 ENCOUNTER — Emergency Department (HOSPITAL_COMMUNITY)
Admission: EM | Admit: 2018-12-04 | Discharge: 2018-12-04 | Disposition: A | Discharge status: Home / Self Care | Payer: BLUE CROSS/BLUE SHIELD | Attending: Emergency Medicine | Admitting: Emergency Medicine

## 2018-12-04 ENCOUNTER — Encounter (HOSPITAL_COMMUNITY): Payer: Self-pay | Admitting: Emergency Medicine

## 2018-12-04 ENCOUNTER — Emergency Department (HOSPITAL_COMMUNITY): Payer: BLUE CROSS/BLUE SHIELD

## 2018-12-04 DIAGNOSIS — N3001 Acute cystitis with hematuria: Secondary | ICD-10-CM | POA: Diagnosis not present

## 2018-12-04 DIAGNOSIS — Z91040 Latex allergy status: Secondary | ICD-10-CM | POA: Diagnosis not present

## 2018-12-04 DIAGNOSIS — Z79899 Other long term (current) drug therapy: Secondary | ICD-10-CM | POA: Diagnosis not present

## 2018-12-04 DIAGNOSIS — F17210 Nicotine dependence, cigarettes, uncomplicated: Secondary | ICD-10-CM | POA: Insufficient documentation

## 2018-12-04 DIAGNOSIS — R109 Unspecified abdominal pain: Secondary | ICD-10-CM

## 2018-12-04 DIAGNOSIS — R102 Pelvic and perineal pain: Secondary | ICD-10-CM | POA: Diagnosis present

## 2018-12-04 LAB — COMPREHENSIVE METABOLIC PANEL
ALBUMIN: 3.5 g/dL (ref 3.5–5.0)
ALT: 46 U/L — ABNORMAL HIGH (ref 0–44)
ANION GAP: 9 (ref 5–15)
AST: 28 U/L (ref 15–41)
Alkaline Phosphatase: 67 U/L (ref 38–126)
BUN: 14 mg/dL (ref 6–20)
CHLORIDE: 103 mmol/L (ref 98–111)
CO2: 24 mmol/L (ref 22–32)
Calcium: 9.1 mg/dL (ref 8.9–10.3)
Creatinine, Ser: 0.77 mg/dL (ref 0.44–1.00)
GFR calc Af Amer: >60 mL/min (ref >60)
GFR calc non Af Amer: >60 mL/min (ref >60)
GLUCOSE: 145 mg/dL — AB (ref 70–99)
POTASSIUM: 3.3 mmol/L — AB (ref 3.5–5.1)
Sodium: 136 mmol/L (ref 135–145)
Total Bilirubin: 0.5 mg/dL (ref 0.3–1.2)
Total Protein: 6.8 g/dL (ref 6.5–8.1)

## 2018-12-04 LAB — URINALYSIS, ROUTINE W REFLEX MICROSCOPIC
BILIRUBIN URINE: NEGATIVE
Glucose, UA: NEGATIVE mg/dL
KETONES UR: NEGATIVE mg/dL
Nitrite: NEGATIVE
PH: 5 (ref 5.0–8.0)
PROTEIN: 30 mg/dL — AB
Specific Gravity, Urine: 1.015 (ref 1.005–1.030)

## 2018-12-04 LAB — CBC
HEMATOCRIT: 39.1 % (ref 36.0–46.0)
HEMOGLOBIN: 12.5 g/dL (ref 12.0–15.0)
MCH: 29.1 pg (ref 26.0–34.0)
MCHC: 32 g/dL (ref 30.0–36.0)
MCV: 90.9 fL (ref 80.0–100.0)
Platelets: 355 10*3/uL (ref 150–400)
RBC: 4.3 MIL/uL (ref 3.87–5.11)
RDW: 13.5 % (ref 11.5–15.5)
WBC: 13.2 10*3/uL — AB (ref 4.0–10.5)
nRBC: 0 % (ref 0.0–0.2)

## 2018-12-04 LAB — I-STAT BETA HCG BLOOD, ED (MC, WL, AP ONLY): I-stat hCG, quantitative: <5 m[IU]/mL (ref <5)

## 2018-12-04 LAB — LIPASE, BLOOD: LIPASE: 24 U/L (ref 11–51)

## 2018-12-04 MED ORDER — HYDROMORPHONE HCL 1 MG/ML IJ SOLN
1.0000 mg | Freq: Once | INTRAMUSCULAR | Status: AC
  Administered 2018-12-04: 1 mg via INTRAVENOUS
  Filled 2018-12-04: qty 1

## 2018-12-04 MED ORDER — HYDROCODONE-ACETAMINOPHEN 5-325 MG PO TABS: 1.0000 | ORAL_TABLET | ORAL | 0 refills | Status: DC | PRN

## 2018-12-04 MED ORDER — IOHEXOL 300 MG/ML  SOLN
100.0000 mL | Freq: Once | INTRAMUSCULAR | Status: AC | PRN
  Administered 2018-12-04: 100 mL via INTRAVENOUS

## 2018-12-04 MED ORDER — OXYCODONE-ACETAMINOPHEN 5-325 MG PO TABS
1.0000 | ORAL_TABLET | ORAL | Status: DC | PRN
  Administered 2018-12-04: 1 via ORAL
  Filled 2018-12-04: qty 1

## 2018-12-04 MED ORDER — CEPHALEXIN 500 MG PO CAPS: 500.0000 mg | ORAL_CAPSULE | Freq: Two times a day (BID) | ORAL | 0 refills | Status: DC

## 2018-12-04 MED ORDER — OXYCODONE-ACETAMINOPHEN 5-325 MG PO TABS
1.0000 | ORAL_TABLET | Freq: Once | ORAL | Status: AC
  Administered 2018-12-04: 1 via ORAL
  Filled 2018-12-04: qty 1

## 2018-12-04 MED ORDER — SODIUM CHLORIDE 0.9 % IV BOLUS
1000.0000 mL | Freq: Once | INTRAVENOUS | Status: AC
  Administered 2018-12-04: 1000 mL via INTRAVENOUS

## 2018-12-04 MED ORDER — SODIUM CHLORIDE 0.9 % IV SOLN
1.0000 g | Freq: Once | INTRAVENOUS | Status: AC
  Administered 2018-12-04: 1 g via INTRAVENOUS
  Filled 2018-12-04: qty 10

## 2018-12-04 MED ORDER — ONDANSETRON 8 MG PO TBDP: 8.0000 mg | ORAL_TABLET | Freq: Three times a day (TID) | ORAL | 0 refills | Status: DC | PRN

## 2018-12-04 NOTE — ED Notes (Signed)
Pt came up to [facility] after being

## 2018-12-04 NOTE — ED Provider Notes (Signed)
[facility] EMERGENCY [facility]PARTMENT Provider Note   CSN: [id number] Arrival date & time: 12/04/18  0248     History   Chief Complaint Chief Complaint  Patient presents with  . Abdominal Pain  . Pelvic Pain    HPI Madison Blackburn is a 43 y.o. female.  HPI Patient is a [age] year old female who presents the emergency department complaints of worsening suprapubic and lower abdominal pressure and pain that is continued to worsen through the night with some radiation out towards the right flank.  No prior history of kidney stones.  Pain is constant and moderate to severe in severity.  Reports nausea without vomiting.  Denies diarrhea.  Chills last night without documented fever.  No dysuria or urinary frequency.  No vaginal complaints.  She has had prior C-sections and a prior cholecystectomy   Past Medical History:  Diagnosis Date  . DDD (degenerative disc disease), lumbar   . Sciatica   . Scoliosis     There are no active problems to display for this patient.   Past Surgical History:  Procedure Laterality Date  . CHOLECYSTECTOMY       OB History   No obstetric history on file.      Home Medications    Prior to Admission medications   Medication Sig Start Date End Date Taking? Authorizing Provider  Diclofenac Sodium 3 % GEL Place 1 application onto the skin 2 (two) times daily. 07/29/16  Yes Wallis BambergMani, Mario, PA-C  cephALEXin (KEFLEX) 500 MG capsule Take 1 capsule (500 mg total) by mouth 2 (two) times daily. 12/04/18   Azalia Bilisampos, Vaniya Augspurger, MD  HYDROcodone-acetaminophen (NORCO/VICODIN) 5-325 MG tablet Take 1 tablet by mouth every 4 (four) hours as needed for moderate pain. 12/04/18   Azalia Bilisampos, Daimen Shovlin, MD  ondansetron (ZOFRAN ODT) 8 MG disintegrating tablet Take 1 tablet (8 mg total) by mouth every 8 (eight) hours as needed for nausea or vomiting. 12/04/18   Azalia Bilisampos, Dove Gresham, MD    Family History No family history on file.  Social History Social History   Tobacco Use  . Smoking  status: Current Some Day Smoker    Types: Cigarettes  . Smokeless tobacco: Never Used  Substance Use Topics  . Alcohol use: No  . Drug use: No     Allergies   Latex   Review of Systems Review of Systems  All other systems reviewed and are negative.    Physical Exam Updated Vital Signs BP 118/70   Pulse (!) 103   Temp 98.4 F (36.9 C) (Oral)   Resp 16   Ht 5\' 4"  (1.626 m)   Wt 90.7 kg   SpO2 100%   BMI 34.33 kg/m   Physical Exam Vitals signs and nursing note reviewed.  Constitutional:      General: She is not in acute distress.    Appearance: She is well-developed.  HENT:     Head: Normocephalic and atraumatic.  Neck:     Musculoskeletal: Normal range of motion.  Cardiovascular:     Rate and Rhythm: Normal rate and regular rhythm.     Heart sounds: Normal heart sounds.  Pulmonary:     Effort: Pulmonary effort is normal.     Breath sounds: Normal breath sounds.  Abdominal:     General: There is no distension.     Palpations: Abdomen is soft.     Comments: Lower abdominal tenderness and right lower quadrant tenderness  Musculoskeletal: Normal range of motion.  Skin:  General: Skin is warm and dry.  Neurological:     Mental Status: She is alert and oriented to person, place, and time.  Psychiatric:        Judgment: Judgment normal.      ED Treatments / Results  Labs (all labs ordered are listed, but only abnormal results are displayed) Labs Reviewed  COMPREHENSIVE METABOLIC PANEL - Abnormal; Notable for the following components:      Result Value   Potassium 3.3 (*)    Glucose, Bld 145 (*)    ALT 46 (*)    All other components within normal limits  CBC - Abnormal; Notable for the following components:   WBC 13.2 (*)    All other components within normal limits  URINALYSIS, ROUTINE W REFLEX MICROSCOPIC - Abnormal; Notable for the following components:   APPearance HAZY (*)    Hgb urine dipstick LARGE (*)    Protein, ur 30 (*)    Leukocytes,  UA MODERATE (*)    WBC, UA >50 (*)    Bacteria, UA FEW (*)    All other components within normal limits  URINE CULTURE  LIPASE, BLOOD  I-STAT BETA HCG BLOOD, ED ([facility], [facility], [facility] ONLY)    EKG None  Radiology Ct Abdomen Pelvis W Contrast  Result Date: 12/04/2018 CLINICAL DATA:  Abdominal pain on the right for 1 hour EXAM: CT ABDOMEN AND PELVIS WITH CONTRAST TECHNIQUE: Multidetector CT imaging of the abdomen and pelvis was performed using the standard protocol following bolus administration of intravenous contrast. CONTRAST:  OMNIPAQUE IOHEXOL 300 MG/ML  SOLN COMPARISON:  None. FINDINGS: Lower chest: No acute abnormality. Hepatobiliary: No focal liver abnormality is seen. Status post cholecystectomy. No biliary dilatation. Pancreas: Unremarkable. No pancreatic ductal dilatation or surrounding inflammatory changes. Spleen: Normal in size without focal abnormality. Adrenals/Urinary Tract: Adrenal glands are within normal limits bilaterally. The left kidney demonstrates an ovoid cyst within the midportion measuring 2.7 cm. No calculi or obstructive changes are noted. The bladder is partially distended. On the right, there perinephric inflammatory changes as well as some inflammatory change of the proximal and mid right ureter without obstructing lesion. This likely represents a degree of pyelonephritis and urinary tract infection. Stomach/Bowel: The appendix is not well visualized although no inflammatory changes to suggest appendicitis are noted. The colon is decompressed. No large or small bowel inflammatory changes are seen. The stomach is within normal limits. Vascular/Lymphatic: No significant vascular findings are present. No enlarged abdominal or pelvic lymph nodes. Reproductive: Uterus and bilateral adnexa are unremarkable. Other: No abdominal wall hernia or abnormality. No abdominopelvic ascites. Musculoskeletal: No acute or significant osseous findings. IMPRESSION: Right perinephric and  periureteral inflammatory changes suspicious for urinary tract infection and possible pyelonephritis. Electronically Signed   By: Alcide Clever M.D.   On: 12/04/2018 09:28    Procedures Procedures (including critical care time)  Medications Ordered in ED Medications  oxyCODONE-acetaminophen (PERCOCET/ROXICET) 5-325 MG per tablet 1 tablet (1 tablet Oral Given 12/04/18 0309)  oxyCODONE-acetaminophen (PERCOCET/ROXICET) 5-325 MG per tablet 1 tablet (has no administration in time range)  HYDROmorphone (DILAUDID) injection 1 mg (1 mg Intravenous Given 12/04/18 0811)  sodium chloride 0.9 % bolus 1,000 mL (0 mLs Intravenous Stopped 12/04/18 1113)  iohexol (OMNIPAQUE) 300 MG/ML solution 100 mL (100 mLs Intravenous Contrast Given 12/04/18 0912)  cefTRIAXone (ROCEPHIN) 1 g in sodium chloride 0.9 % 100 mL IVPB (0 g Intravenous Stopped 12/04/18 1100)     Initial Impression / Assessment and Plan / ED Course  I have reviewed the triage vital signs and the nursing notes.  Pertinent labs & imaging results that were available during my care of the patient were reviewed by me and considered in my medical decision making (see chart for details).     Pain improving in the emergency department.  Symptoms and urine concerning for acute cystitis with developing pyelonephritis which is verified on CT imaging as well.  Appendix is normal on CT.  No other clear intra-abdominal pathology to explain patient's symptoms.  Urine culture sent.  Rocephin in the emergency department.  Home with Keflex and standard medications.  Patient understands to return to the emergency department for new or worsening symptoms    Final Clinical Impressions(s) / ED Diagnoses   Final diagnoses:  Acute abdominal pain  Acute cystitis with hematuria    ED Discharge Orders         Ordered    HYDROcodone-acetaminophen (NORCO/VICODIN) 5-325 MG tablet  Every 4 hours PRN     12/04/18 1113    cephALEXin (KEFLEX) 500 MG capsule  2 times daily      12/04/18 1113    ondansetron (ZOFRAN ODT) 8 MG disintegrating tablet  Every 8 hours PRN     12/04/18 1113           Azalia Bilisampos, Candence Sease, MD 12/04/18 1116

## 2018-12-04 NOTE — ED Triage Notes (Signed)
Pt reports right lower abdominal pain that has spread into the pelvic area described as pressure, sudden onset about an hour ago.  Pt tearful in triage.

## 2018-12-04 NOTE — ED Notes (Signed)
Patient verbalizes understanding of discharge instructions. Opportunity for questioning and answers were provided. Armband removed by staff, pt discharged from ED.  

## 2018-12-04 NOTE — ED Notes (Signed)
Pt returns from ct scan. 

## 2018-12-06 LAB — URINE CULTURE

## 2018-12-07 ENCOUNTER — Telehealth: Payer: Self-pay | Admitting: Emergency Medicine

## 2018-12-07 NOTE — Telephone Encounter (Signed)
Post ED Visit - Positive Culture Follow-up  Culture report reviewed by antimicrobial stewardship pharmacist:  []  Enzo Bi, Pharm.D. []  Celedonio Miyamoto, Pharm.D., BCPS AQ-ID []  Garvin Fila, Pharm.D., BCPS []  Georgina Pillion, Pharm.D., BCPS []  [place], [address].D., BCPS, AAHIVP []  Estella Husk, Pharm.D., BCPS, AAHIVP [x]  Lysle Pearl, PharmD, BCPS []  Phillips Climes, PharmD, BCPS []  Agapito Games, PharmD, BCPS []  Verlan Friends, PharmD  Positive Urine culture Treated with Cephalexin, organism sensitive to the same and no further patient follow-up is required at this time.  Norm Parcel RN 12/07/2018, 10:33 AM

## 2019-10-26 ENCOUNTER — Other Ambulatory Visit: Payer: Self-pay

## 2019-10-26 ENCOUNTER — Emergency Department (HOSPITAL_COMMUNITY)
Admission: EM | Admit: 2019-10-26 | Discharge: 2019-10-26 | Disposition: A | Discharge status: Home / Self Care | Payer: BLUE CROSS/BLUE SHIELD | Attending: Emergency Medicine | Admitting: Emergency Medicine

## 2019-10-26 ENCOUNTER — Encounter (HOSPITAL_COMMUNITY): Payer: Self-pay

## 2019-10-26 DIAGNOSIS — N390 Urinary tract infection, site not specified: Secondary | ICD-10-CM | POA: Insufficient documentation

## 2019-10-26 DIAGNOSIS — Z79899 Other long term (current) drug therapy: Secondary | ICD-10-CM | POA: Diagnosis not present

## 2019-10-26 DIAGNOSIS — M5489 Other dorsalgia: Secondary | ICD-10-CM | POA: Diagnosis present

## 2019-10-26 DIAGNOSIS — F17210 Nicotine dependence, cigarettes, uncomplicated: Secondary | ICD-10-CM | POA: Insufficient documentation

## 2019-10-26 DIAGNOSIS — M545 Low back pain: Secondary | ICD-10-CM | POA: Insufficient documentation

## 2019-10-26 DIAGNOSIS — G8929 Other chronic pain: Secondary | ICD-10-CM

## 2019-10-26 LAB — URINALYSIS, ROUTINE W REFLEX MICROSCOPIC
Bilirubin Urine: NEGATIVE
Glucose, UA: NEGATIVE mg/dL
Ketones, ur: NEGATIVE mg/dL
Nitrite: NEGATIVE
Protein, ur: NEGATIVE mg/dL
Specific Gravity, Urine: 1.024 (ref 1.005–1.030)
WBC, UA: >50 WBC/hpf — ABNORMAL HIGH (ref 0–5)
pH: 5 (ref 5.0–8.0)

## 2019-10-26 LAB — PREGNANCY, URINE: Preg Test, Ur: NEGATIVE

## 2019-10-26 MED ORDER — CEPHALEXIN 500 MG PO CAPS: 500.0000 mg | ORAL_CAPSULE | Freq: Four times a day (QID) | ORAL | 0 refills | Status: DC

## 2019-10-26 MED ORDER — HYDROCODONE-ACETAMINOPHEN 5-325 MG PO TABS: ORAL_TABLET | ORAL | 0 refills | Status: DC

## 2019-10-26 MED ORDER — OXYCODONE-ACETAMINOPHEN 5-325 MG PO TABS
1.0000 | ORAL_TABLET | Freq: Once | ORAL | Status: AC
  Administered 2019-10-26: 1 via ORAL
  Filled 2019-10-26: qty 1

## 2019-10-26 MED ORDER — KETOROLAC TROMETHAMINE 60 MG/2ML IM SOLN
60.0000 mg | Freq: Once | INTRAMUSCULAR | Status: AC
  Administered 2019-10-26: 60 mg via INTRAMUSCULAR
  Filled 2019-10-26: qty 2

## 2019-10-26 NOTE — ED Triage Notes (Signed)
Pt presents to ED with complaints of lower back pain x 3 days. Pt denies urinary symptoms. Pt denies any radiating pain.

## 2019-10-26 NOTE — ED Notes (Signed)
Spec to lab

## 2019-10-26 NOTE — ED Notes (Signed)
TT in to assess 

## 2019-10-26 NOTE — ED Provider Notes (Signed)
[facility] EMERGENCY DEPARTMENT Provider Note   CSN: [id number] Arrival date & time: 10/26/19  1021     History   Chief Complaint Chief Complaint  Patient presents with  . Back Pain    HPI Madison Blackburn is a 43 y.o. female.     HPI   Madison Blackburn is a 43 y.o. female with PMH of chronic low back pain, scoliosis, presents to the Emergency Department complaining of gradually worsening bilateral low back pain.  Symptoms have been worse for 3 days.  She describes a constant pain to the mid lower back that radiates to both sides of her back. She states that she has recently switched to another pain management provider and has her first appointment this coming week.  She denies new or changing symptoms,  No recent injury.  She has tried OTC pain relievers without improvement.  She had a tele visit this week and was prescribed steroids.  She denies fever, chills, urine or bowel changes, abdominal pain, nausea or vomiting and pain, numbness or weakness of her lower extremities.      Past Medical History:  Diagnosis Date  . DDD (degenerative disc disease), lumbar   . DDD (degenerative disc disease), lumbar   . Sciatica   . Scoliosis     There are no active problems to display for this patient.   Past Surgical History:  Procedure Laterality Date  . CHOLECYSTECTOMY       OB History   No obstetric history on file.      Home Medications    Prior to Admission medications   Medication Sig Start Date End Date Taking? Authorizing Provider  cephALEXin (KEFLEX) 500 MG capsule Take 1 capsule (500 mg total) by mouth 4 (four) times daily. For 7 days 10/26/19   Rudell Ortman, PA-C  Diclofenac Sodium 3 % GEL Place 1 application onto the skin 2 (two) times daily. 07/29/16   Jaynee Eagles, PA-C  HYDROcodone-acetaminophen (NORCO/VICODIN) 5-325 MG tablet Take one tab po q 4 hrs prn pain 10/26/19   Jousha Schwandt, PA-C  ondansetron (ZOFRAN ODT) 8 MG disintegrating tablet Take 1 tablet (8 mg  total) by mouth every 8 (eight) hours as needed for nausea or vomiting. 12/04/18   Jola Schmidt, MD    Family History No family history on file.  Social History Social History   Tobacco Use  . Smoking status: Current Some Day Smoker    Packs/day: 0.50    Types: Cigarettes  . Smokeless tobacco: Never Used  Substance Use Topics  . Alcohol use: No  . Drug use: No     Allergies   Latex   Review of Systems Review of Systems  Constitutional: Negative for appetite change and fever.  Respiratory: Negative for shortness of breath.   Gastrointestinal: Negative for abdominal pain, constipation and vomiting.  Genitourinary: Negative for decreased urine volume, difficulty urinating, dysuria, flank pain and pelvic pain.  Musculoskeletal: Positive for back pain. Negative for joint swelling.  Skin: Negative for rash.  Neurological: Negative for weakness and numbness.     Physical Exam Updated Vital Signs BP 128/84 (BP Location: Right Arm)   Pulse 84   Temp 97.6 F (36.4 C) (Oral)   Resp 18   Ht 5\' 4"  (1.626 m)   Wt 90.7 kg   LMP 10/24/2019   SpO2 100%   BMI 34.33 kg/m   Physical Exam Vitals signs and nursing note reviewed.  Constitutional:      General: She is  not in acute distress.    Appearance: Normal appearance. She is well-developed. She is not ill-appearing.  HENT:     Head: Normocephalic and atraumatic.  Neck:     Musculoskeletal: Normal range of motion and neck supple.  Cardiovascular:     Rate and Rhythm: Normal rate and regular rhythm.     Pulses: Normal pulses.     Comments: DP pulses are strong and palpable bilaterally Pulmonary:     Effort: Pulmonary effort is normal. No respiratory distress.     Breath sounds: Normal breath sounds.  Abdominal:     General: There is no distension.     Palpations: Abdomen is soft.     Tenderness: There is no abdominal tenderness. There is no right CVA tenderness or left CVA tenderness.  Musculoskeletal:         General: Tenderness present.     Lumbar back: She exhibits tenderness and pain. She exhibits normal range of motion, no swelling, no deformity, no laceration and normal pulse.     Right lower leg: No edema.     Comments: ttp of the lumbar spine and bilateral lumbar paraspinal muscles. Pt has 5/5 strength against resistance of bilateral lower extremities.  Hip flexors and extensors intact   Skin:    General: Skin is warm and dry.     Capillary Refill: Capillary refill takes less than 2 seconds.     Findings: No rash.  Neurological:     Mental Status: She is alert and oriented to person, place, and time.     Sensory: No sensory deficit.     Motor: No abnormal muscle tone.     Coordination: Coordination normal.     Gait: Gait normal.     Deep Tendon Reflexes:     Reflex Scores:      Patellar reflexes are 2+ on the right side and 2+ on the left side.      Achilles reflexes are 2+ on the right side and 2+ on the left side.     ED Treatments / Results  Labs (all labs ordered are listed, but only abnormal results are displayed) Labs Reviewed  URINALYSIS, ROUTINE W REFLEX MICROSCOPIC - Abnormal; Notable for the following components:      Result Value   APPearance HAZY (*)    Hgb urine dipstick LARGE (*)    Leukocytes,Ua LARGE (*)    WBC, UA >50 (*)    Bacteria, UA MANY (*)    All other components within normal limits  URINE CULTURE  PREGNANCY, URINE    EKG None  Radiology No results found.  Procedures Procedures (including critical care time)  Medications Ordered in ED Medications  ketorolac (TORADOL) injection 60 mg (60 mg Intramuscular Given 10/26/19 1219)  oxyCODONE-acetaminophen (PERCOCET/ROXICET) 5-325 MG per tablet 1 tablet (1 tablet Oral Given 10/26/19 1218)     Initial Impression / Assessment and Plan / ED Course  I have reviewed the triage vital signs and the nursing notes.  Pertinent labs & imaging results that were available during my care of the patient  were reviewed by me and considered in my medical decision making (see chart for details).        Pt with acute on chronic low back pain.  No focal neuro deficits.  No concerning sx's for cauda equina, no recent spinal injections, fever to suggest spinal abscess.  U/A shows infection, culture pending.  Doubt pyelonephritis.  Will start on keflex, she agrees to close f/u with her  pain management provider this week.    Final Clinical Impressions(s) / ED Diagnoses   Final diagnoses:  Chronic bilateral low back pain without sciatica  Urinary tract infection in female    ED Discharge Orders         Ordered    cephALEXin (KEFLEX) 500 MG capsule  4 times daily     10/26/19 1209    HYDROcodone-acetaminophen (NORCO/VICODIN) 5-325 MG tablet     10/26/19 1209           Pauline Aus, PA-C 10/26/19 1703    Jacalyn Lefevre, MD 10/27/19 2211

## 2019-10-26 NOTE — ED Notes (Signed)
Pt with chronic back pain here for evaluation for same   Ambulates heel to toe to room with easy, loose gait

## 2019-10-26 NOTE — Discharge Instructions (Addendum)
Is important that you take the antibiotic as directed until it is finished.  Drink plenty of water.  Be sure to keep your appointment with your pain management provider for this week.

## 2019-10-28 ENCOUNTER — Emergency Department (HOSPITAL_COMMUNITY): Payer: BLUE CROSS/BLUE SHIELD

## 2019-10-28 ENCOUNTER — Encounter (HOSPITAL_COMMUNITY): Payer: Self-pay

## 2019-10-28 ENCOUNTER — Other Ambulatory Visit: Payer: Self-pay

## 2019-10-28 ENCOUNTER — Emergency Department (HOSPITAL_COMMUNITY)
Admission: EM | Admit: 2019-10-28 | Discharge: 2019-10-28 | Discharge status: Against Medical Advice | Payer: BLUE CROSS/BLUE SHIELD | Attending: Emergency Medicine | Admitting: Emergency Medicine

## 2019-10-28 DIAGNOSIS — M545 Low back pain, unspecified: Secondary | ICD-10-CM

## 2019-10-28 DIAGNOSIS — F17210 Nicotine dependence, cigarettes, uncomplicated: Secondary | ICD-10-CM | POA: Insufficient documentation

## 2019-10-28 DIAGNOSIS — N390 Urinary tract infection, site not specified: Secondary | ICD-10-CM | POA: Insufficient documentation

## 2019-10-28 DIAGNOSIS — Z91040 Latex allergy status: Secondary | ICD-10-CM | POA: Insufficient documentation

## 2019-10-28 DIAGNOSIS — Z5320 Procedure and treatment not carried out because of patient's decision for unspecified reasons: Secondary | ICD-10-CM | POA: Diagnosis not present

## 2019-10-28 LAB — COMPREHENSIVE METABOLIC PANEL
ALT: 11 U/L (ref 0–44)
AST: 12 U/L — ABNORMAL LOW (ref 15–41)
Albumin: 4.2 g/dL (ref 3.5–5.0)
Alkaline Phosphatase: 61 U/L (ref 38–126)
Anion gap: 11 (ref 5–15)
BUN: 6 mg/dL (ref 6–20)
CO2: 24 mmol/L (ref 22–32)
Calcium: 9.2 mg/dL (ref 8.9–10.3)
Chloride: 105 mmol/L (ref 98–111)
Creatinine, Ser: 0.55 mg/dL (ref 0.44–1.00)
GFR calc Af Amer: >60 mL/min (ref >60)
GFR calc non Af Amer: >60 mL/min (ref >60)
Glucose, Bld: 94 mg/dL (ref 70–99)
Potassium: 3.4 mmol/L — ABNORMAL LOW (ref 3.5–5.1)
Sodium: 140 mmol/L (ref 135–145)
Total Bilirubin: 0.8 mg/dL (ref 0.3–1.2)
Total Protein: 7.2 g/dL (ref 6.5–8.1)

## 2019-10-28 LAB — URINALYSIS, ROUTINE W REFLEX MICROSCOPIC
Bilirubin Urine: NEGATIVE
Glucose, UA: NEGATIVE mg/dL
Ketones, ur: NEGATIVE mg/dL
Nitrite: NEGATIVE
Protein, ur: NEGATIVE mg/dL
Specific Gravity, Urine: 1.011 (ref 1.005–1.030)
pH: 6 (ref 5.0–8.0)

## 2019-10-28 LAB — CBC WITH DIFFERENTIAL/PLATELET
Abs Immature Granulocytes: 0.03 10*3/uL (ref 0.00–0.07)
Basophils Absolute: 0 10*3/uL (ref 0.0–0.1)
Basophils Relative: 0 %
Eosinophils Absolute: 0.1 10*3/uL (ref 0.0–0.5)
Eosinophils Relative: 1 %
HCT: 42.3 % (ref 36.0–46.0)
Hemoglobin: 14.1 g/dL (ref 12.0–15.0)
Immature Granulocytes: 0 %
Lymphocytes Relative: 22 %
Lymphs Abs: 2.5 10*3/uL (ref 0.7–4.0)
MCH: 30.6 pg (ref 26.0–34.0)
MCHC: 33.3 g/dL (ref 30.0–36.0)
MCV: 91.8 fL (ref 80.0–100.0)
Monocytes Absolute: 0.5 10*3/uL (ref 0.1–1.0)
Monocytes Relative: 4 %
Neutro Abs: 8.1 10*3/uL — ABNORMAL HIGH (ref 1.7–7.7)
Neutrophils Relative %: 73 %
Platelets: 303 10*3/uL (ref 150–400)
RBC: 4.61 MIL/uL (ref 3.87–5.11)
RDW: 13.7 % (ref 11.5–15.5)
WBC: 11.2 10*3/uL — ABNORMAL HIGH (ref 4.0–10.5)
nRBC: 0 % (ref 0.0–0.2)

## 2019-10-28 LAB — URINE CULTURE: Culture: >=100000 — AB

## 2019-10-28 LAB — PREGNANCY, URINE: Preg Test, Ur: NEGATIVE

## 2019-10-28 LAB — LIPASE, BLOOD: Lipase: 26 U/L (ref 11–51)

## 2019-10-28 MED ORDER — ACETAMINOPHEN 325 MG PO TABS
650.0000 mg | ORAL_TABLET | Freq: Once | ORAL | Status: AC
  Administered 2019-10-28: 650 mg via ORAL
  Filled 2019-10-28: qty 2

## 2019-10-28 MED ORDER — SODIUM CHLORIDE (PF) 0.9 % IJ SOLN
INTRAMUSCULAR | Status: AC
  Filled 2019-10-28: qty 50

## 2019-10-28 MED ORDER — IOHEXOL 300 MG/ML  SOLN: 100.0000 mL | Freq: Once | INTRAMUSCULAR | Status: DC | PRN

## 2019-10-28 NOTE — ED Notes (Signed)
Pt ambulated to the restroom with no assitance 

## 2019-10-28 NOTE — ED Triage Notes (Signed)
Pt arrives POV from home. Pt reports that she was seen at [facility] over the weekend and dx with UTI. Pt started on Keflex. Pt reports she has been taking it two days with no relief. Pt reports back pain and abd pain. Pt denies fevers.

## 2019-10-28 NOTE — ED Notes (Signed)
Pt started yelling at nurse for pain meds.  Pt demanding pain meds. Pt now leaving AMA, refuses provider attempts to give non narcotics/ ibprofen Pt demanding iv out and warm blanket. Pt crying, Iv removed. Pt asked for vital signs refused  Pt signed AMA and left

## 2019-10-28 NOTE — ED Provider Notes (Signed)
[facility]-EMERGENCY DEPT Provider Note   CSN: [id number] Arrival date & time: 10/28/19  1549     History   Chief Complaint Chief Complaint  Patient presents with  . Abdominal Pain  . Back Pain    HPI Madison Blackburn is a 43 y.o. female with PMHx scoliosis, sciatica, DDD, and chronic low back pain who presents to the ED today with complaint of gradual onset, constant, worsening, bilateral low back pain x 3-4 days.  She is also complaining of diffuse lower abdominal tenderness that started today.  She reports that she was seen at [facility] emergency department on 11/29 for worsening back pain that she states is different than her typical chronic low back pain and was diagnosed with a UTI and started on Keflex.  Patient states that she has been taking this for 2 days now without relief of her low back pain and she was concerned given new abdominal pain today.  He states that she has been urinating more frequently than normal despite being on antibiotics as well.  Denies fever, chills, nausea, vomiting, diarrhea, dysuria, pelvic pain, vaginal discharge.  Patient is currently on her menses.        Past Medical History:  Diagnosis Date  . DDD (degenerative disc disease), lumbar   . DDD (degenerative disc disease), lumbar   . Sciatica   . Scoliosis     There are no active problems to display for this patient.   Past Surgical History:  Procedure Laterality Date  . CHOLECYSTECTOMY       OB History   No obstetric history on file.      Home Medications    Prior to Admission medications   Medication Sig Start Date End Date Taking? Authorizing Provider  cephALEXin (KEFLEX) 500 MG capsule Take 1 capsule (500 mg total) by mouth 4 (four) times daily. For 7 days 10/26/19  Yes Triplett, Tammy, PA-C  Diclofenac Sodium 3 % GEL Place 1 application onto the skin 2 (two) times daily. Patient not taking: Reported on 10/28/2019 07/29/16   Wallis BambergMani, Mario, PA-C   HYDROcodone-acetaminophen (NORCO/VICODIN) 5-325 MG tablet Take one tab po q 4 hrs prn pain Patient not taking: Reported on 10/28/2019 10/26/19   Triplett, Tammy, PA-C  methylPREDNISolone (MEDROL DOSEPAK) 4 MG TBPK tablet Take 1 tablet by mouth See admin instructions. As directed per pack 10/25/19   [provider]  ondansetron (ZOFRAN ODT) 8 MG disintegrating tablet Take 1 tablet (8 mg total) by mouth every 8 (eight) hours as needed for nausea or vomiting. Patient not taking: Reported on 10/28/2019 12/04/18   Azalia Bilisampos, Kevin, MD    Family History History reviewed. No pertinent family history.  Social History Social History   Tobacco Use  . Smoking status: Current Some Day Smoker    Packs/day: 0.50    Types: Cigarettes  . Smokeless tobacco: Never Used  Substance Use Topics  . Alcohol use: No  . Drug use: No     Allergies   Latex   Review of Systems Review of Systems  Constitutional: Negative for chills and fever.  HENT: Negative for congestion.   Eyes: Negative for visual disturbance.  Respiratory: Negative for cough and shortness of breath.   Cardiovascular: Negative for chest pain.  Gastrointestinal: Positive for abdominal pain. Negative for diarrhea, nausea and vomiting.  Genitourinary: Positive for frequency. Negative for dysuria, menstrual problem, pelvic pain and vaginal discharge.  Musculoskeletal: Positive for back pain.  Skin: Negative for rash.  Neurological:  Negative for headaches.     Physical Exam Updated Vital Signs BP (!) 163/98 (BP Location: Left Arm)   Pulse 82   Temp 97.8 F (36.6 C) (Oral)   Resp 18   Wt 90.7 kg   LMP 10/24/2019   SpO2 100%   BMI 34.33 kg/m   Physical Exam Vitals signs and nursing note reviewed.  Constitutional:      Appearance: She is obese. She is not ill-appearing.  HENT:     Head: Normocephalic and atraumatic.  Eyes:     Conjunctiva/sclera: Conjunctivae normal.  Neck:     Musculoskeletal: Neck supple.   Cardiovascular:     Rate and Rhythm: Normal rate and regular rhythm.     Heart sounds: Normal heart sounds.  Pulmonary:     Effort: Pulmonary effort is normal.     Breath sounds: Normal breath sounds. No wheezing, rhonchi or rales.  Abdominal:     Palpations: Abdomen is soft.     Tenderness: There is abdominal tenderness in the right lower quadrant, suprapubic area and left lower quadrant. There is no right CVA tenderness, left CVA tenderness, guarding or rebound. Negative signs include McBurney's sign.  Musculoskeletal:       Arms:     Comments: No C, T, or L midline spinal tenderness. + Bilateral lumbar paraspinal muscular TTP. Strength 5/5 to BLEs. Sensation intact throughout. 2+ DP and PT pulses.   Skin:    General: Skin is warm and dry.  Neurological:     Mental Status: She is alert.      ED Treatments / Results  Labs (all labs ordered are listed, but only abnormal results are displayed) Labs Reviewed  COMPREHENSIVE METABOLIC PANEL - Abnormal; Notable for the following components:      Result Value   Potassium 3.4 (*)    AST 12 (*)    All other components within normal limits  CBC WITH DIFFERENTIAL/PLATELET - Abnormal; Notable for the following components:   WBC 11.2 (*)    Neutro Abs 8.1 (*)    All other components within normal limits  URINALYSIS, ROUTINE W REFLEX MICROSCOPIC - Abnormal; Notable for the following components:   Hgb urine dipstick MODERATE (*)    Leukocytes,Ua MODERATE (*)    Bacteria, UA RARE (*)    All other components within normal limits  LIPASE, BLOOD  PREGNANCY, URINE    EKG None  Radiology No results found.  Procedures Procedures (including critical care time)  Medications Ordered in ED Medications  sodium chloride (PF) 0.9 % injection (has no administration in time range)  iohexol (OMNIPAQUE) 300 MG/ML solution 100 mL (has no administration in time range)  acetaminophen (TYLENOL) tablet 650 mg (650 mg Oral Given 10/28/19 1902)      Initial Impression / Assessment and Plan / ED Course  I have reviewed the triage vital signs and the nursing notes.  Pertinent labs & imaging results that were available during my care of the patient were reviewed by me and considered in my medical decision making (see chart for details).    43 year old female presents the ED today with worsening lower back pain status post being diagnosed with a UTI on 11/29 at any [facility] emergency department.  Has been compliant with Keflex.  Also now complaining of lower abdominal pain.  Is nontoxic-appearing on exam.  She is afebrile without tachycardia or tachypnea.  Does have very mild bilateral lower abdominal tenderness without rebound or guarding.  Not acute abdomen today.  She  does not have any specific CVA tenderness.  Her pain is mostly in the lower lumbar area bilaterally.  There is suspicion that this is likely more related to patient's chronic low back pain but given return ED visit will obtain CT scan to rule out pyelonephritis at this time. Per chart review it does appear that she saw her PCP today to discuss pain medication and became very upset when they recommended Tylenol and muscle relaxers for her pain. PDMP with #10 Norco prescribed on 11/29 during most recent ED visit.  Given complaint of abdominal pain will obtain screening labs at this time.  We will also recheck urinalysis.  Urine culture done on 11/29 grew greater than 100,000 be strep.  This stability testing was not done due to predictability of ampicillin, penicillin, vancomycin susceptibility.  Patient is currently on Keflex which should be covering appropriately.  Discussed case with pharmacist Merrilee Seashore who agrees.   CBC with leukocytosis at 11,000. Potassium mildly decreased at 3.4; do not feel she needs supplementation at this time. Will discuss increased potassium in diet. No other electrolyte abnormalities. Lipase negative.   U/A with mod leuks and 21-50 WBCS per HPF; improved from 11/29.  Received call from CT scan that pt requesting something more for pain prior to going; again hesitant to give additional pain medication at this time given drug seeking behavior at PCP's office today. Had discussion with patient and offered ibuprofen as well; pt states "I can take that at home." She is signing out [place] prior to obtaining CT scan. Pt advised to continue taking her abx and to follow up with PCP.   This note was prepared using Dragon voice recognition software and may include unintentional dictation errors due to the inherent limitations of voice recognition software.        Final Clinical Impressions(s) / ED Diagnoses   Final diagnoses:  Lower urinary tract infectious disease  Bilateral low back pain without sciatica, unspecified chronicity    ED Discharge Orders    None       Eustaquio Maize, Hershal Coria 10/28/19 2027    Dorie Rank, MD 11/01/19 [phone]

## 2019-10-28 NOTE — ED Notes (Signed)
Refused closing v/s

## 2019-10-29 ENCOUNTER — Telehealth: Payer: Self-pay | Admitting: Emergency Medicine

## 2019-10-29 NOTE — Telephone Encounter (Signed)
Post ED Visit - Positive Culture Follow-up  Culture report reviewed by antimicrobial stewardship pharmacist: [facility] Team []  Elenor Quinones, Pharm.D. []  Heide Guile, Pharm.D., BCPS AQ-ID []  Parks Neptune, Pharm.D., BCPS []  Alycia Rossetti, Pharm.D., BCPS []  [place], Pharm.D., BCPS, AAHIVP []  Legrand Como, Pharm.D., BCPS, AAHIVP [x]  Salome Arnt, PharmD, BCPS []  Johnnette Gourd, PharmD, BCPS []  Hughes Better, PharmD, BCPS []  Leeroy Cha, PharmD []  Laqueta Linden, PharmD, BCPS []  Albertina Parr, PharmD  [place] Team []  Leodis Sias, PharmD []  Lindell Spar, PharmD []  Royetta Asal, PharmD []  Graylin Shiver, Rph []  Rema Fendt) Glennon Mac, PharmD []  Arlyn Dunning, PharmD []  Netta Cedars, PharmD []  Dia Sitter, PharmD []  Leone Haven, PharmD []  Gretta Arab, PharmD []  Theodis Shove, PharmD []  Peggyann Juba, PharmD []  Reuel Boom, PharmD   Positive urine culture Treated with cephalexin, organism sensitive to the same and no further patient follow-up is required at this time.  Hazle Nordmann 10/29/2019, 3:06 PM

## 2019-12-02 ENCOUNTER — Other Ambulatory Visit: Payer: Self-pay

## 2019-12-02 ENCOUNTER — Emergency Department
Admission: EM | Admit: 2019-12-02 | Discharge: 2019-12-02 | Disposition: A | Discharge status: Home / Self Care | Payer: BLUE CROSS/BLUE SHIELD | Attending: Emergency Medicine | Admitting: Emergency Medicine

## 2019-12-02 ENCOUNTER — Encounter: Payer: Self-pay | Admitting: Emergency Medicine

## 2019-12-02 ENCOUNTER — Emergency Department: Payer: BLUE CROSS/BLUE SHIELD

## 2019-12-02 DIAGNOSIS — M545 Low back pain: Secondary | ICD-10-CM | POA: Diagnosis present

## 2019-12-02 DIAGNOSIS — F17210 Nicotine dependence, cigarettes, uncomplicated: Secondary | ICD-10-CM | POA: Insufficient documentation

## 2019-12-02 DIAGNOSIS — M5440 Lumbago with sciatica, unspecified side: Secondary | ICD-10-CM | POA: Insufficient documentation

## 2019-12-02 LAB — POCT PREGNANCY, URINE: Preg Test, Ur: NEGATIVE

## 2019-12-02 MED ORDER — OXYCODONE-ACETAMINOPHEN 5-325 MG PO TABS: 1.0000 | ORAL_TABLET | Freq: Four times a day (QID) | ORAL | 0 refills | Status: AC | PRN

## 2019-12-02 MED ORDER — CYCLOBENZAPRINE HCL 10 MG PO TABS: 10.0000 mg | ORAL_TABLET | Freq: Three times a day (TID) | ORAL | 0 refills | Status: DC | PRN

## 2019-12-02 NOTE — ED Triage Notes (Signed)
Pt reports was pulling a pallet jack and fell hurting her back. Pt c/o pain to lower back. Pt does not wish to file WC at this time.

## 2019-12-02 NOTE — ED Notes (Signed)
See triage note  Presents with lower back pain  States she has chronic back pain but she fell yesterday  Ambulates well to treatment room

## 2019-12-02 NOTE — ED Provider Notes (Signed)
[facility] Emergency Department Provider Note   ____________________________________________   First MD Initiated Contact with Patient 12/02/19 1022     (approximate)  I have reviewed the triage vital signs and the nursing notes.   HISTORY  Chief Complaint Back Pain and Fall    HPI Madison Blackburn is a 44 y.o. female patient complain low back pain secondary to a fall.  Patient that she was pulling a pallet when she fell backwards last night at work.  Patient denies radicular component to her back pain.  Patient denies bladder bowel dysfunction.  Patient is not reporting is a workers comp injury.  Patient has a history of degenerative disc disease.  Patient had initial consult with pain management 2 days ago.  Patient rates her pain as a 10/10.  Patient described pain is "achy".  Patient no relief using Lidoderm patch.     Past Medical History:  Diagnosis Date  . DDD (degenerative disc disease), lumbar   . DDD (degenerative disc disease), lumbar   . Sciatica   . Scoliosis     There are no problems to display for this patient.   Past Surgical History:  Procedure Laterality Date  . CHOLECYSTECTOMY      Prior to Admission medications   Medication Sig Start Date End Date Taking? Authorizing Provider  cyclobenzaprine (FLEXERIL) 10 MG tablet Take 1 tablet (10 mg total) by mouth 3 (three) times daily as needed. 12/02/19   Joni Reining, PA-C  methylPREDNISolone (MEDROL DOSEPAK) 4 MG TBPK tablet Take 1 tablet by mouth See admin instructions. As directed per pack 10/25/19   [provider]  oxyCODONE-acetaminophen (PERCOCET) 5-325 MG tablet Take 1 tablet by mouth every 6 (six) hours as needed for up to 3 days for severe pain. 12/02/19 12/05/19  Joni Reining, PA-C    Allergies Latex  No family history on file.  Social History Social History   Tobacco Use  . Smoking status: Current Some Day Smoker    Packs/day: 0.50    Types: Cigarettes  .  Smokeless tobacco: Never Used  Substance Use Topics  . Alcohol use: No  . Drug use: No    Review of Systems Constitutional: No fever/chills Eyes: No visual changes. ENT: No sore throat. Cardiovascular: Denies chest pain. Respiratory: Denies shortness of breath. Gastrointestinal: No abdominal pain.  No nausea, no vomiting.  No diarrhea.  No constipation. Genitourinary: Negative for dysuria. Musculoskeletal: Positive for back pain. Skin: Negative for rash. Neurological: Negative for headaches, focal weakness or numbness. Allergic/Immunilogical: Latex  ____________________________________________   PHYSICAL EXAM:  VITAL SIGNS: ED Triage Vitals [12/02/19 1017]  Enc Vitals Group     BP      Pulse      Resp      Temp      Temp src      SpO2      Weight 200 lb (90.7 kg)     Height 5\' 4"  (1.626 m)     Head Circumference      Peak Flow      Pain Score 10     Pain Loc      Pain Edu?      Excl. in [place]?    Constitutional: Alert and oriented. Well appearing and in no acute distress. Neck: No stridor.   Hematological/Lymphatic/Immunilogical: No cervical lymphadenopathy. Cardiovascular: Normal rate, regular rhythm. Grossly normal heart sounds.  Good peripheral circulation. Respiratory: Normal respiratory effort.  No retractions. Lungs CTAB. Gastrointestinal: Soft  and nontender. No distention. No abdominal bruits. No CVA tenderness. Musculoskeletal: No lower extremity tenderness nor edema.  No joint effusions. Neurologic:  Normal speech and language. No gross focal neurologic deficits are appreciated. No gait instability. Skin:  Skin is warm, dry and intact. No rash noted. Psychiatric: Mood and affect are normal. Speech and behavior are normal.  ____________________________________________   LABS (all labs ordered are listed, but only abnormal results are displayed)  Labs Reviewed  POC URINE PREG, ED  POCT PREGNANCY, URINE    ____________________________________________  EKG   ____________________________________________  RADIOLOGY  ED MD interpretation:    Official radiology report(s): DG Lumbar Spine 2-3 Views  Result Date: 12/02/2019 CLINICAL DATA:  Fall with subsequent back pain. 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 No sign of regional fracture. Appearance at T12 and L1 on the lateral view relates to curvature and beam divergence. Lower lumbar facet osteoarthritis with mild disc space narrowing at L4-5. IMPRESSION: No acute finding. 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 Electronically Signed   By: Nelson Chimes M.D.   On: 12/02/2019 11:50    ____________________________________________   PROCEDURES  Procedure(s) performed (including Critical Care):  Procedures   ____________________________________________   INITIAL IMPRESSION / ASSESSMENT AND PLAN / ED COURSE  As part of my medical decision making, I reviewed the following data within the [place]     Patient presents with low back pain secondary to fall over yesterday.  There is no radicular component to the back pain.  There is no bladder or bowel dysfunction.  X-ray of the back shows no acute findings.  Patient has some chronic findings consistent with her history.  Patient given discharge care instruction work note.  Patient advised take medication as directed.  Advised follow-up pain management clinic.    Madison Blackburn was evaluated in Emergency Department on 12/02/2019 for the symptoms described in the history of present illness. She was evaluated in the context of the global COVID-19 pandemic, which necessitated consideration that the patient might be at risk for infection with the SARS-CoV-2 virus that causes COVID-19.  Institutional protocols and algorithms that pertain to the evaluation of patients at risk for COVID-19 are in a state of rapid change based on information released by regulatory bodies including the CDC and federal and state organizations. These policies and algorithms were followed during the patient's care in the ED.       ____________________________________________   FINAL CLINICAL IMPRESSION(S) / ED DIAGNOSES  Final diagnoses:  Midline low back pain with sciatica, sciatica laterality unspecified, unspecified chronicity     ED Discharge Orders         Ordered    cyclobenzaprine (FLEXERIL) 10 MG tablet  3 times daily PRN     12/02/19 1202    oxyCODONE-acetaminophen (PERCOCET) 5-325 MG tablet  Every 6 hours PRN     12/02/19 1202           Note:  This document was prepared using Dragon voice recognition software and may include unintentional dictation errors.    Sable Feil, PA-C 12/02/19 1205    Harvest Dark, MD 12/02/19 1400

## 2019-12-08 ENCOUNTER — Other Ambulatory Visit: Payer: Self-pay

## 2019-12-08 ENCOUNTER — Encounter (HOSPITAL_COMMUNITY): Payer: Self-pay

## 2019-12-08 ENCOUNTER — Ambulatory Visit (HOSPITAL_COMMUNITY)
Admission: EM | Admit: 2019-12-08 | Discharge: 2019-12-08 | Disposition: A | Discharge status: Home / Self Care | Payer: BLUE CROSS/BLUE SHIELD | Attending: Family Medicine | Admitting: Family Medicine

## 2019-12-08 DIAGNOSIS — M545 Low back pain, unspecified: Secondary | ICD-10-CM

## 2019-12-08 DIAGNOSIS — G8929 Other chronic pain: Secondary | ICD-10-CM

## 2019-12-08 MED ORDER — HYDROCODONE-ACETAMINOPHEN 5-325 MG PO TABS: 1.0000 | ORAL_TABLET | Freq: Four times a day (QID) | ORAL | 0 refills | Status: DC | PRN

## 2019-12-08 NOTE — ED Triage Notes (Signed)
Pt presents with chronic back pain; pt states she had a fall on her back last week.

## 2019-12-08 NOTE — Discharge Instructions (Addendum)
Strategies to prevent and/or treat COVID-19:  Vitamin D3 5000 IU (125 mcg) daily Vitamin C 500 mg twice daily Zinc 50 to 75 mg daily  Listerine type mouthwash 4 times a day  Start your physical therapy rehabilitation with plank, back arching, and stair stepping for 15 minutes daily  You need to get in a rigorous and comprehensive physical therapy program to reeducate the muscles in your abdomen and back.  I have included in this note top-notch physical therapist to call.  Another resource that may help you is a woman named Madelyn Brunner (telephone number [phone])

## 2019-12-08 NOTE — ED Provider Notes (Signed)
[facility]    CSN: [id number] Arrival date & time: 12/08/19  1251      History   Chief Complaint Chief Complaint  Patient presents with  . Back Pain    HPI Madison Blackburn is a 44 y.o. female.   Is a 44 year old established [facility] urgent care patient with acute on chronic back pain.  She has multiple prescriptions for narcotics over the last 4 months.  Pt presents with chronic back pain; pt states she had a fall on her back last week.  Patient works as a Librarian, academic in Geophysicist/field seismologist area.  She has tried physical therapy in the past as well as muscle relaxers but nothing seems to work.  She does not have sciatic pain.  Rather, she has dull constant pain in her low midline lumbar region.  Patient has a medical appointment and follow-up in 2 days at [facility]     Past Medical History:  Diagnosis Date  . DDD (degenerative disc disease), lumbar   . DDD (degenerative disc disease), lumbar   . Sciatica   . Scoliosis     There are no problems to display for this patient.   Past Surgical History:  Procedure Laterality Date  . CHOLECYSTECTOMY      OB History   No obstetric history on file.      Home Medications    Prior to Admission medications   Medication Sig Start Date End Date Taking? Authorizing Provider  HYDROcodone-acetaminophen (NORCO) 5-325 MG tablet Take 1 tablet by mouth every 6 (six) hours as needed for moderate pain. 12/08/19   Robyn Haber, MD    Family History Family History  Family history unknown: Yes    Social History Social History   Tobacco Use  . Smoking status: Current Some Day Smoker    Packs/day: 0.50    Types: Cigarettes  . Smokeless tobacco: Never Used  Substance Use Topics  . Alcohol use: No  . Drug use: No     Allergies   Latex   Review of Systems Review of Systems  Musculoskeletal: Positive for back pain.  All other systems reviewed and are negative.    Physical Exam Triage Vital  Signs ED Triage Vitals  Enc Vitals Group     BP 12/08/19 1326 (!) 147/85     Pulse Rate 12/08/19 1326 68     Resp 12/08/19 1326 17     Temp 12/08/19 1326 98.3 F (36.8 C)     Temp Source 12/08/19 1326 Oral     SpO2 12/08/19 1326 100 %     Weight --      Height --      Head Circumference --      Peak Flow --      Pain Score 12/08/19 1325 9     Pain Loc --      Pain Edu? --      Excl. in [place]? --    No data found.  Updated Vital Signs BP (!) 147/85 (BP Location: Right Arm)   Pulse 68   Temp 98.3 F (36.8 C) (Oral)   Resp 17   LMP 11/11/2019   SpO2 100%    Physical Exam Vitals and nursing note reviewed.  Constitutional:      Appearance: Normal appearance.  Eyes:     Conjunctiva/sclera: Conjunctivae normal.     Comments: Patient had lid lag on the left.  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  Musculoskeletal:        General: Normal range of motion.     Cervical back: Normal range of motion and neck supple.  Skin:    General: Skin is warm and dry.  Neurological:     General: No focal deficit present.     Mental Status: She is alert and oriented to person, place, and time.  Psychiatric:        Mood and Affect: Mood normal.        Behavior: Behavior normal.        Thought Content: Thought content normal.        Judgment: Judgment normal.      UC Treatments / Results  Labs (all labs ordered are listed, but only abnormal results are displayed) Labs Reviewed - No data to display  EKG   Radiology No results found.  Procedures Procedures (including critical care time)  Medications Ordered in UC Medications - No data to display  Initial Impression / Assessment and Plan / UC Course  I have reviewed the triage vital signs and the nursing notes.  Pertinent labs & imaging results that were available during my care of the patient were reviewed by me and considered in my  medical decision making (see chart for details).    Final Clinical Impressions(s) / UC Diagnoses   Final diagnoses:  Chronic midline low back pain without sciatica     Discharge Instructions     Strategies to prevent and/or treat COVID-19:  Vitamin D3 5000 IU (125 mcg) daily Vitamin C 500 mg twice daily Zinc 50 to 75 mg daily  Listerine type mouthwash 4 times a day  Start your physical therapy rehabilitation with plank, back arching, and stair stepping for 15 minutes daily  You need to get in a rigorous and comprehensive physical therapy program to reeducate the muscles in your abdomen and back.  I have included in this note top-notch physical therapist to call.  Another resource that may help you is a woman named Madelyn Brunner (telephone number [phone])     ED Prescriptions    Medication Sig Dispense Auth. Provider   HYDROcodone-acetaminophen (NORCO) 5-325 MG tablet Take 1 tablet by mouth every 6 (six) hours as needed for moderate pain. 12 tablet Elvina Sidle, MD     I have reviewed the PDMP during this encounter.   Elvina Sidle, MD 12/08/19 1415

## 2019-12-10 ENCOUNTER — Emergency Department
Admission: EM | Admit: 2019-12-10 | Discharge: 2019-12-10 | Disposition: A | Discharge status: Home / Self Care | Payer: BLUE CROSS/BLUE SHIELD | Attending: Emergency Medicine | Admitting: Emergency Medicine

## 2019-12-10 ENCOUNTER — Encounter: Payer: Self-pay | Admitting: *Deleted

## 2019-12-10 ENCOUNTER — Other Ambulatory Visit: Payer: Self-pay

## 2019-12-10 DIAGNOSIS — F17210 Nicotine dependence, cigarettes, uncomplicated: Secondary | ICD-10-CM | POA: Diagnosis not present

## 2019-12-10 DIAGNOSIS — M545 Low back pain, unspecified: Secondary | ICD-10-CM

## 2019-12-10 NOTE — ED Notes (Signed)
Pt left without discharge instructions/paperwork.  Pt ambulating down hallway with NAD noted.  Dr. Fanny Bien aware.

## 2019-12-10 NOTE — ED Provider Notes (Signed)
[facility] Emergency Department Provider Note ____________________________________________   First MD Initiated Contact with Patient 12/10/19 2003     (approximate)  I have reviewed the triage vital signs and the nursing notes.   HISTORY  Chief Complaint Back Pain  HPI Madison Blackburn is a 44 y.o. female here for evaluation of low back pain  Patient reports she has been experiencing chronic low back pain, typically takes hydrocodone for this.  She has lapsed between visits with her pain doctor due to insurance issues, but is now scheduled to see her doctor on Friday.  She reports however that her pain has been aggravated for the last several days, she had a fall couple weeks ago and then yesterday at work was lifting heavy boxes without any obvious injury but noticed that her discomfort became aggravated thereafter  She describes a moderate to severe pain in her mid to low back which she describes as a "L4-L5 from previous experience.  No trouble with her bowel or bladder.  No numbness or tingling in her feet.  No weakness in her legs.  Just increased pain in her typical location.     Past Medical History:  Diagnosis Date  . DDD (degenerative disc disease), lumbar   . DDD (degenerative disc disease), lumbar   . Sciatica   . Scoliosis     There are no problems to display for this patient.   Past Surgical History:  Procedure Laterality Date  . CHOLECYSTECTOMY      Prior to Admission medications   Medication Sig Start Date End Date Taking? Authorizing Provider  HYDROcodone-acetaminophen (NORCO) 5-325 MG tablet Take 1 tablet by mouth every 6 (six) hours as needed for moderate pain. 12/08/19   Elvina Sidle, MD    Allergies Latex  Family History  Family history unknown: Yes    Social History Social History   Tobacco Use  . Smoking status: Current Some Day Smoker    Packs/day: 0.50    Types: Cigarettes  . Smokeless tobacco: Never Used    Substance Use Topics  . Alcohol use: No  . Drug use: No    Review of Systems Constitutional: No fever/chills Gastrointestinal: No abdominal pain.   Musculoskeletal: Positive for back pain.  Denies pain radiating into her legs.  No numbness or weakness in her legs. Skin: Negative for rash. Neurological: Negative for focal weakness or numbness. Urinary: Denies any bowel or bladder incontinence   ____________________________________________   PHYSICAL EXAM:  VITAL SIGNS: ED Triage Vitals [12/10/19 1945]  Enc Vitals Group     BP (!) 159/80     Pulse Rate 80     Resp 18     Temp 97.8 F (36.6 C)     Temp Source Oral     SpO2 100 %     Weight 200 lb (90.7 kg)     Height 5\' 4"  (1.626 m)     Head Circumference      Peak Flow      Pain Score 10     Pain Loc      Pain Edu?      Excl. in GC?     Constitutional: Alert and oriented. Well appearing and in no acute distress. Eyes: Conjunctivae are normal. Head: Atraumatic and may be a slight lazy eye on the left. Nose: No congestion/rhinnorhea. Mouth/Throat: Mucous membranes are moist. Neck: No stridor.  Cardiovascular: Normal rate, regular rhythm. Grossly normal heart sounds.  Good peripheral circulation.  Normal distal  circulation lower legs bilateral. Respiratory: Normal respiratory effort.  No retractions. Lungs CTAB. Gastrointestinal: Soft and nontender. No distention. Musculoskeletal: No lower extremity tenderness nor edema.  Full range of motion lower legs.  5 out of 5 strength.  Normal sensation throughout the lower extremities bilateral.  Walks with normal gait.  Patient reports moderate midline tenderness in the low lumbar region, no tenderness of the remaining lumbar and thoracic regions.  No saddle anesthesia. Neurologic:  Normal speech and language. No gross focal neurologic deficits are appreciated.  Skin:  Skin is warm, dry and intact. No rash noted. Psychiatric: Mood and affect are normal. Speech and behavior are  normal.  ____________________________________________   LABS (all labs ordered are listed, but only abnormal results are displayed)  Labs Reviewed - No data to display ____________________________________________  EKG   ____________________________________________  RADIOLOGY  Previous imaging from January 5 reviewed, there were no acute findings noted at that time. ____________________________________________   PROCEDURES  Procedure(s) performed: None  Procedures  Critical Care performed: No  ____________________________________________   INITIAL IMPRESSION / ASSESSMENT AND PLAN / ED COURSE  Pertinent labs & imaging results that were available during my care of the patient were reviewed by me and considered in my medical decision making (see chart for details).   Upon my initial assessment, patient has low back pain appears to be exacerbation of chronic pain.  She does not appear to have any evidence of red flags for cauda equina or neurologic deficits associated.  Afebrile, no signs or symptoms of infection.  No red flags noted  Started discussing pain options with the patient, she is adamant that she requires hydrocodone or prescription pain medication.  A begin reviewing her prescription history and recent visits, and informed the patient due to her previous visit and prescriptions that I was not comfortable prescribing her narcotic pain medication.  Patient refused to consider other nonnarcotic pain treatment options.  Patient stood up, became somewhat upset about not having narcotic pain options I could prescribe her, and thereafter eloped from the ER.  She was not excepting of further advice or discharge instructions.  She did appear to have capacity to make a decision to continue care and treatment and look at other options, but refused.    Patient does have a upcoming appointment on Friday with her pain physician now, I did encourage her to follow-up that she plans  to.  ____________________________________________   FINAL CLINICAL IMPRESSION(S) / ED DIAGNOSES  Final diagnoses:  Midline low back pain without sciatica, unspecified chronicity        Note:  This document was prepared using Dragon voice recognition software and may include unintentional dictation errors       Delman Kitten, MD 12/10/19 2029

## 2019-12-10 NOTE — ED Notes (Signed)
PT has chronic pain that has worsened from heavy lifting at work. PT is followed by pain clinic and has not been able to get her medicine. PT ambulatory.

## 2019-12-10 NOTE — ED Triage Notes (Signed)
Pt reports chronic back pain.  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  Pt states pain in lower back.  No  urinary sx.  Pt alert  Speech clear.

## 2020-01-03 ENCOUNTER — Other Ambulatory Visit: Payer: Self-pay

## 2020-01-03 ENCOUNTER — Emergency Department (HOSPITAL_COMMUNITY)
Admission: EM | Admit: 2020-01-03 | Discharge: 2020-01-03 | Disposition: A | Discharge status: Home / Self Care | Payer: BLUE CROSS/BLUE SHIELD | Attending: Emergency Medicine | Admitting: Emergency Medicine

## 2020-01-03 ENCOUNTER — Encounter (HOSPITAL_COMMUNITY): Payer: Self-pay

## 2020-01-03 DIAGNOSIS — F17210 Nicotine dependence, cigarettes, uncomplicated: Secondary | ICD-10-CM | POA: Insufficient documentation

## 2020-01-03 DIAGNOSIS — N3001 Acute cystitis with hematuria: Secondary | ICD-10-CM | POA: Insufficient documentation

## 2020-01-03 DIAGNOSIS — R300 Dysuria: Secondary | ICD-10-CM | POA: Diagnosis present

## 2020-01-03 LAB — URINALYSIS, ROUTINE W REFLEX MICROSCOPIC
Bilirubin Urine: NEGATIVE
Glucose, UA: NEGATIVE mg/dL
Ketones, ur: NEGATIVE mg/dL
Nitrite: NEGATIVE
Protein, ur: NEGATIVE mg/dL
Specific Gravity, Urine: 1.026 (ref 1.005–1.030)
WBC, UA: >50 WBC/hpf — ABNORMAL HIGH (ref 0–5)
pH: 5 (ref 5.0–8.0)

## 2020-01-03 LAB — PREGNANCY, URINE: Preg Test, Ur: NEGATIVE

## 2020-01-03 MED ORDER — CEPHALEXIN 500 MG PO CAPS: 500.0000 mg | ORAL_CAPSULE | Freq: Four times a day (QID) | ORAL | 0 refills | Status: DC

## 2020-01-03 MED ORDER — PHENAZOPYRIDINE HCL 100 MG PO TABS
200.0000 mg | ORAL_TABLET | Freq: Once | ORAL | Status: AC
  Administered 2020-01-03: 200 mg via ORAL
  Filled 2020-01-03: qty 2

## 2020-01-03 MED ORDER — CEPHALEXIN 500 MG PO CAPS
500.0000 mg | ORAL_CAPSULE | Freq: Once | ORAL | Status: AC
  Administered 2020-01-03: 500 mg via ORAL
  Filled 2020-01-03: qty 1

## 2020-01-03 MED ORDER — PHENAZOPYRIDINE HCL 200 MG PO TABS: 200.0000 mg | ORAL_TABLET | Freq: Three times a day (TID) | ORAL | 0 refills | Status: DC

## 2020-01-03 NOTE — ED Triage Notes (Signed)
Pt c/o burning with urination x2 days . York Spaniel it feels like it usually does when she has a UTI.

## 2020-01-03 NOTE — ED Provider Notes (Signed)
[facility] EMERGENCY DEPARTMENT Provider Note   CSN: [id number] Arrival date & time: 01/03/20  [place]     History Chief Complaint  Patient presents with  . Urinary Tract Infection    Abiola L Nancarrow is a 44 y.o. female.  HPI      Nyonna L Cliburn is a 44 y.o. female who presents to the Emergency Department complaining of burning with urination for 2 days.  She also complains of urinary frequency and urgency, but at times, only voiding small amounts.  Yesterday, she began having pain to her left flank as well.  She describes the pain as mild, but constant.  She states the current symptoms feel similar to previous UTI's and she notes having them frequently.  She denies abdominal pain, fever, chills, vomiting and hematuria.  No hx of kidney stones, vaginal bleeding, vaginal discharge.  She is in a monogamous relationship with her husband.     Past Medical History:  Diagnosis Date  . DDD (degenerative disc disease), lumbar   . DDD (degenerative disc disease), lumbar   . Sciatica   . Scoliosis     There are no problems to display for this patient.   Past Surgical History:  Procedure Laterality Date  . CHOLECYSTECTOMY       OB History   No obstetric history on file.     Family History  Family history unknown: Yes    Social History   Tobacco Use  . Smoking status: Current Some Day Smoker    Packs/day: 0.50    Types: Cigarettes  . Smokeless tobacco: Never Used  Substance Use Topics  . Alcohol use: No  . Drug use: No    Home Medications Prior to Admission medications   Medication Sig Start Date End Date Taking? Authorizing Provider  HYDROcodone-acetaminophen (NORCO) 5-325 MG tablet Take 1 tablet by mouth every 6 (six) hours as needed for moderate pain. 12/08/19   Robyn Haber, MD    Allergies    Latex  Review of Systems   Review of Systems  Constitutional: Negative for activity change, appetite change, chills and fever.  Respiratory: Negative for chest  tightness and shortness of breath.   Gastrointestinal: Negative for abdominal pain, diarrhea, nausea and vomiting.  Genitourinary: Positive for dysuria, flank pain, frequency and urgency. Negative for decreased urine volume, difficulty urinating, hematuria, pelvic pain, vaginal bleeding and vaginal discharge.  Musculoskeletal: Negative for back pain.  Skin: Negative for rash.  Neurological: Negative for dizziness, weakness and numbness.  Hematological: Negative for adenopathy.  Psychiatric/Behavioral: Negative for confusion.    Physical Exam Updated Vital Signs BP (!) 147/86   Pulse 71   Temp 98.2 F (36.8 C)   Resp 19   Ht 5\' 4"  (1.626 m)   Wt 90.7 kg   SpO2 100%   BMI 34.33 kg/m   Physical Exam Vitals and nursing note reviewed.  Constitutional:      General: She is not in acute distress.    Appearance: Normal appearance. She is not ill-appearing.  HENT:     Mouth/Throat:     Mouth: Mucous membranes are moist.  Cardiovascular:     Rate and Rhythm: Normal rate and regular rhythm.     Pulses: Normal pulses.  Pulmonary:     Effort: Pulmonary effort is normal.     Breath sounds: Normal breath sounds.  Abdominal:     General: There is no distension.     Palpations: Abdomen is soft.     Tenderness: There  is no abdominal tenderness. There is left CVA tenderness. There is no right CVA tenderness.     Comments: Mild left CVA tenderness, abdomen is soft, non tender.   Musculoskeletal:        General: Normal range of motion.  Skin:    General: Skin is warm.     Capillary Refill: Capillary refill takes less than 2 seconds.     Findings: No rash.  Neurological:     General: No focal deficit present.     Mental Status: She is alert.     Sensory: No sensory deficit.     Motor: No weakness.     ED Results / Procedures / Treatments   Labs (all labs ordered are listed, but only abnormal results are displayed) Labs Reviewed  URINALYSIS, ROUTINE W REFLEX MICROSCOPIC -  Abnormal; Notable for the following components:      Result Value   APPearance HAZY (*)    Hgb urine dipstick SMALL (*)    Leukocytes,Ua MODERATE (*)    WBC, UA >50 (*)    Bacteria, UA RARE (*)    All other components within normal limits  URINE CULTURE  PREGNANCY, URINE    EKG None  Radiology No results found.  Procedures Procedures (including critical care time)  Medications Ordered in ED Medications - No data to display  ED Course  I have reviewed the triage vital signs and the nursing notes.  Pertinent labs & imaging results that were available during my care of the patient were reviewed by me and considered in my medical decision making (see chart for details).    MDM Rules/Calculators/A&P                      Pt with left CVA tenderness and dysuria.  She is well appearing.  Non-toxic. vitals reviewed.  Possible early developing pyelonephritis. Urine culture pending.  abd is soft, non-tender.  Will treat with Keflex and pyridium. She appears appropriate for d/c home,  Pt agrees to close f/u and return precautions discussed.     Final Clinical Impression(s) / ED Diagnoses Final diagnoses:  Acute cystitis with hematuria    Rx / DC Orders ED Discharge Orders    None       Rosey Bath 01/03/20 2036    Mancel Bale, MD 01/04/20 2336

## 2020-01-03 NOTE — ED Notes (Signed)
Pt provided Heat Pack for lower back discomfort

## 2020-01-03 NOTE — ED Notes (Signed)
ED Provider at bedside. 

## 2020-01-03 NOTE — Discharge Instructions (Addendum)
Take the antibiotic as directed until its finished.  Drink plenty of water.  Follow-up with your doctor or return here for any worsening symptoms such as vomiting, fever or increasing pain.

## 2020-01-05 LAB — URINE CULTURE
Culture: >=100000 — AB
Special Requests: NORMAL

## 2020-01-06 ENCOUNTER — Telehealth: Payer: Self-pay

## 2020-01-06 NOTE — Telephone Encounter (Signed)
Post ED Visit - Positive Culture Follow-up  Culture report reviewed by antimicrobial stewardship pharmacist: [facility] []  , Pharm.D. []  Enzo Bi, Pharm.D., BCPS AQ-ID []  , Pharm.D., BCPS []  Celedonio Miyamoto, Pharm.D., BCPS []  Braham, Garvin Fila.D., BCPS, AAHIVP []  , Pharm.D., BCPS, AAHIVP [x]  Georgina Pillion, PharmD, BCPS []  , PharmD, BCPS []  [place], PharmD, BCPS []  [address], PharmD []  , PharmD, BCPS []  Estella Husk, PharmD  Pharmacy Team []  Lysle Pearl, PharmD []  , PharmD []  Phillips Climes, PharmD []  , Rph []  Agapito Games) , PharmD []  Verlan Friends, PharmD []  , PharmD []  Mervyn Gay, PharmD []  , PharmD []  Vinnie Level, PharmD []  [facility], PharmD []  , PharmD []  Len Childs, PharmD   Positive urine culture Treated with Cephalexin, organism sensitive to the same and no further patient follow-up is required at this time.  01/06/2020, 8:57 AM

## 2020-04-04 ENCOUNTER — Emergency Department (HOSPITAL_COMMUNITY): Payer: BLUE CROSS/BLUE SHIELD

## 2020-04-04 ENCOUNTER — Other Ambulatory Visit: Payer: Self-pay

## 2020-04-04 ENCOUNTER — Emergency Department (HOSPITAL_COMMUNITY)
Admission: EM | Admit: 2020-04-04 | Discharge: 2020-04-04 | Disposition: A | Discharge status: Home / Self Care | Payer: BLUE CROSS/BLUE SHIELD | Attending: Emergency Medicine | Admitting: Emergency Medicine

## 2020-04-04 ENCOUNTER — Encounter (HOSPITAL_COMMUNITY): Payer: Self-pay

## 2020-04-04 DIAGNOSIS — F17210 Nicotine dependence, cigarettes, uncomplicated: Secondary | ICD-10-CM | POA: Diagnosis not present

## 2020-04-04 DIAGNOSIS — Z91040 Latex allergy status: Secondary | ICD-10-CM | POA: Diagnosis not present

## 2020-04-04 DIAGNOSIS — Z79899 Other long term (current) drug therapy: Secondary | ICD-10-CM | POA: Insufficient documentation

## 2020-04-04 DIAGNOSIS — M25562 Pain in left knee: Secondary | ICD-10-CM | POA: Diagnosis not present

## 2020-04-04 MED ORDER — MELOXICAM 7.5 MG PO TABS: 7.5000 mg | ORAL_TABLET | Freq: Every day | ORAL | 0 refills | Status: AC

## 2020-04-04 MED ORDER — HYDROCODONE-ACETAMINOPHEN 5-325 MG PO TABS
1.0000 | ORAL_TABLET | Freq: Once | ORAL | Status: AC
  Administered 2020-04-04: 1 via ORAL
  Filled 2020-04-04: qty 1

## 2020-04-04 NOTE — ED Provider Notes (Signed)
[facility] EMERGENCY DEPARTMENT Provider Note   CSN: [id number] Arrival date & time: 04/04/20  0830     History Chief Complaint  Patient presents with  . Knee Pain    Madison Blackburn is a 44 y.o. female with past medical history significant for chronic low back pain for which she had been prescribed hydrocodone through her pain management clinic who presents to the ED with a 1 week history of atraumatic left knee pain.  Patient states that she is experiencing left knee discomfort, particularly when standing up or sitting down at the toilet.  She states that the painful "pressure" behind her patella is something that is new and unusual for her.  She states that there was a needle in that knee when she was 44 years old and she is curious as to whether or not that is playing a role.  She has been taking NSAIDs, Tylenol, and applying Voltaren and various other topical analgesics with little relief.  She has tried Celebrex and multiple other high potency NSAIDs, without success.  She states that she owes money to her prior pain management provider and so she has since had to schedule a new appointment with a different provider.  She sees them for a new patient encounter on Wednesday, 04/07/2020.  She denies any precipitating trauma, increased activity, fevers or chills, redness or swelling, IVDA, recent gonorrhea infection, numbness or weakness, difficulty ambulating, or other symptoms.  HPI     Past Medical History:  Diagnosis Date  . DDD (degenerative disc disease), lumbar   . DDD (degenerative disc disease), lumbar   . Sciatica   . Scoliosis     There are no problems to display for this patient.   Past Surgical History:  Procedure Laterality Date  . CHOLECYSTECTOMY       OB History   No obstetric history on file.     Family History  Family history unknown: Yes    Social History   Tobacco Use  . Smoking status: Current Some Day Smoker    Packs/day: 0.50    Types: Cigarettes   . Smokeless tobacco: Never Used  Substance Use Topics  . Alcohol use: No  . Drug use: No    Home Medications Prior to Admission medications   Medication Sig Start Date End Date Taking? Authorizing Provider  cephALEXin (KEFLEX) 500 MG capsule Take 1 capsule (500 mg total) by mouth 4 (four) times daily. 01/03/20   Triplett, Tammy, PA-C  HYDROcodone-acetaminophen (NORCO) 5-325 MG tablet Take 1 tablet by mouth every 6 (six) hours as needed for moderate pain. 12/08/19   Elvina Sidle, MD  meloxicam (MOBIC) 7.5 MG tablet Take 1 tablet (7.5 mg total) by mouth daily for 7 days. 04/04/20 04/11/20  Lorelee New, PA-C  phenazopyridine (PYRIDIUM) 200 MG tablet Take 1 tablet (200 mg total) by mouth 3 (three) times daily. 01/03/20   Triplett, Tammy, PA-C    Allergies    Celebrex [celecoxib] and Latex  Review of Systems   Review of Systems  Musculoskeletal: Positive for arthralgias. Negative for gait problem and joint swelling.  Skin: Negative for color change and wound.  Neurological: Negative for weakness and numbness.    Physical Exam Updated Vital Signs BP (!) 142/93 (BP Location: Left Arm)   Pulse 82   Temp (!) 97.5 F (36.4 C) (Oral)   Resp 18   Ht 5\' 3"  (1.6 m)   Wt 99.8 kg   LMP 03/28/2020   SpO2 100%  BMI 38.97 kg/m   Physical Exam Vitals and nursing note reviewed. Exam conducted with a chaperone present.  Constitutional:      General: She is not in acute distress.    Appearance: Normal appearance. She is obese.  HENT:     Head: Normocephalic and atraumatic.  Eyes:     General: No scleral icterus.    Conjunctiva/sclera: Conjunctivae normal.  Cardiovascular:     Rate and Rhythm: Normal rate and regular rhythm.     Pulses: Normal pulses.     Heart sounds: Normal heart sounds.  Pulmonary:     Effort: Pulmonary effort is normal. No respiratory distress.     Breath sounds: Normal breath sounds.  Musculoskeletal:     Comments: Left knee: No swelling or asymmetry when  compared to right knee.  No overlying erythema, warmth, ecchymoses, or other skin changes.  ROM and strength fully intact.  No ballottement.  Negative varus and valgus stress testing.  No distal quadriceps TTP.  Mild TTP surrounding patella, nonspecific. Left ankle/foot: Normal ROM/strength.  Pedal pulse intact.  Sensation intact throughout. Left hip: Normal ROM/strength.  Skin:    General: Skin is dry.     Capillary Refill: Capillary refill takes less than 2 seconds.  Neurological:     Mental Status: She is alert.     GCS: GCS eye subscore is 4. GCS verbal subscore is 5. GCS motor subscore is 6.  Psychiatric:        Mood and Affect: Mood normal.        Behavior: Behavior normal.        Thought Content: Thought content normal.     ED Results / Procedures / Treatments   Labs (all labs ordered are listed, but only abnormal results are displayed) Labs Reviewed - No data to display  EKG None  Radiology DG Knee Complete 4 Views Left  Result Date: 04/04/2020 CLINICAL DATA:  pain in left knee x 1 week.  no recent injurypain EXAM: LEFT KNEE - COMPLETE 4+ VIEW COMPARISON:  None. FINDINGS: No fracture of the proximal tibia or distal femur. Patella is normal. No joint effusion. IMPRESSION: No fracture or dislocation. Electronically Signed   By: Suzy Bouchard M.D.   On: 04/04/2020 09:23    Procedures Procedures (including critical care time)  Medications Ordered in ED Medications  HYDROcodone-acetaminophen (NORCO/VICODIN) 5-325 MG per tablet 1 tablet (has no administration in time range)    ED Course  I have reviewed the triage vital signs and the nursing notes.  Pertinent labs & imaging results that were available during my care of the patient were reviewed by me and considered in my medical decision making (see chart for details).    MDM Rules/Calculators/A&P                      Patient's history of atraumatic left knee discomfort was initially suspicious for an infected for  inflammatory arthritis, however her physical exam was entirely reassuring.  There is no swelling, redness, diminished ROM/strength, or other physical exam findings that are concerning.  I personally reviewed plain films obtained of left knee which demonstrated no acute osseous abnormalities.  She denies any significant gait limitation or instability, however will place in a knee sleeve and refer to orthopedics for ongoing evaluation and management.  She does have a documented history of DDD and arthritis, suspect there could be an arthritic component.  She may ultimately benefit from steroid injection.  I reviewed patient's  PDMP review and she was last prescribed Hys[place]la ER and Vicodin by her pain management provider, Merryl Hacker, NP, on 01/02/2020 for 30 days.  Given that she has tried innumerable over-the-counter analgesics without success, encouraging her to go to her scheduled appointment with pain management for ongoing pain control.  I feel as though it is reasonable to treat her pain with a Vicodin here in the ED and apply sleeve.  Will also prescribe meloxicam.  She may also benefit from physical therapy.  Encouraged her to discuss this with her primary care provider.  Strict ED return precautions discussed with the patient.  All of the evaluation and work-up results were discussed with the patient and any family at bedside. They were provided opportunity to ask any additional questions and have none at this time. They have expressed understanding of verbal discharge instructions as well as return precautions and are agreeable to the plan.    Final Clinical Impression(s) / ED Diagnoses Final diagnoses:  Acute pain of left knee    Rx / DC Orders ED Discharge Orders         Ordered    meloxicam (MOBIC) 7.5 MG tablet  Daily     04/04/20 0952           Lorelee New, PA-C 04/04/20 8676    Bethann Berkshire, MD 04/04/20 1459

## 2020-04-04 NOTE — ED Triage Notes (Signed)
Pt reports pain in left knee x 1 week.  Denies recent injury.

## 2020-04-04 NOTE — Discharge Instructions (Addendum)
Please call the office of Dr. Romeo Apple to schedule appointment for ongoing evaluation and management of your left knee discomfort.  You may benefit from steroid injections.  Please go to your pain management appointment on Wednesday, as scheduled.  Discuss today's encounter with your primary care provider.  You may also benefit from physical therapy.  Please take the medication, as scribed.  Do not combine with other NSAIDs.  Continue to apply Voltaren gel and other topical pain relief.  You may alternate ice and heat to the affected area and continue to elevate the leg when possible.  Return to the ED or seek immediate medical attention for any new or worsening symptoms.

## 2020-05-21 ENCOUNTER — Encounter (HOSPITAL_COMMUNITY): Payer: Self-pay | Admitting: Emergency Medicine

## 2020-05-21 ENCOUNTER — Emergency Department (HOSPITAL_COMMUNITY): Payer: BLUE CROSS/BLUE SHIELD

## 2020-05-21 ENCOUNTER — Other Ambulatory Visit: Payer: Self-pay

## 2020-05-21 ENCOUNTER — Emergency Department (HOSPITAL_COMMUNITY)
Admission: EM | Admit: 2020-05-21 | Discharge: 2020-05-21 | Disposition: A | Discharge status: Home / Self Care | Payer: BLUE CROSS/BLUE SHIELD | Attending: Emergency Medicine | Admitting: Emergency Medicine

## 2020-05-21 DIAGNOSIS — M25512 Pain in left shoulder: Secondary | ICD-10-CM | POA: Insufficient documentation

## 2020-05-21 DIAGNOSIS — M542 Cervicalgia: Secondary | ICD-10-CM | POA: Insufficient documentation

## 2020-05-21 DIAGNOSIS — F17210 Nicotine dependence, cigarettes, uncomplicated: Secondary | ICD-10-CM | POA: Insufficient documentation

## 2020-05-21 DIAGNOSIS — Z91040 Latex allergy status: Secondary | ICD-10-CM | POA: Diagnosis not present

## 2020-05-21 MED ORDER — METHOCARBAMOL 500 MG PO TABS: 500.0000 mg | ORAL_TABLET | Freq: Two times a day (BID) | ORAL | 0 refills | Status: AC

## 2020-05-21 NOTE — ED Provider Notes (Signed)
[facility] EMERGENCY DEPARTMENT Provider Note   CSN: [id number] Arrival date & time: 05/21/20  1059     History Chief Complaint  Patient presents with  . Torticollis    Madison Blackburn is a 44 y.o. female.  HPI She presents for evaluation of left neck and left shoulder pain, with radiation to the left elbow, for 2 weeks, persistently.  No known trauma other than possibly "slept on my neck wrong."  She has a history of degenerative changes in the lower back and has received numerous prescriptions, chronically, narcotics, for the same.  She does not have previous documented neck arthritis.  She denies fever, chills, nausea or dizziness.  There are no other known modifying factors.    Past Medical History:  Diagnosis Date  . DDD (degenerative disc disease), lumbar   . DDD (degenerative disc disease), lumbar   . Sciatica   . Scoliosis     There are no problems to display for this patient.   Past Surgical History:  Procedure Laterality Date  . CHOLECYSTECTOMY       OB History   No obstetric history on file.     Family History  Family history unknown: Yes    Social History   Tobacco Use  . Smoking status: Current Some Day Smoker    Packs/day: 0.50    Types: Cigarettes  . Smokeless tobacco: Never Used  Substance Use Topics  . Alcohol use: No  . Drug use: No    Home Medications Prior to Admission medications   Medication Sig Start Date End Date Taking? Authorizing Provider  cephALEXin (KEFLEX) 500 MG capsule Take 1 capsule (500 mg total) by mouth 4 (four) times daily. 01/03/20   Triplett, Tammy, PA-C  HYDROcodone-acetaminophen (NORCO) 5-325 MG tablet Take 1 tablet by mouth every 6 (six) hours as needed for moderate pain. 12/08/19   Elvina Sidle, MD  methocarbamol (ROBAXIN) 500 MG tablet Take 1 tablet (500 mg total) by mouth 2 (two) times daily. 05/21/20   Mancel Bale, MD  phenazopyridine (PYRIDIUM) 200 MG tablet Take 1 tablet (200 mg total) by mouth 3 (three)  times daily. 01/03/20   Triplett, Tammy, PA-C    Allergies    Celebrex [celecoxib] and Latex  Review of Systems   Review of Systems  All other systems reviewed and are negative.   Physical Exam Updated Vital Signs BP 121/82 (BP Location: Right Arm)   Pulse 69   Temp 97.8 F (36.6 C) (Oral)   Resp 18   Ht 5\' 4"  (1.626 m)   Wt 99.8 kg   LMP 04/22/2020   SpO2 100%   BMI 37.76 kg/m   Physical Exam Vitals and nursing note reviewed.  Constitutional:      Appearance: She is well-developed.  HENT:     Head: Normocephalic and atraumatic.     Right Ear: External ear normal.     Left Ear: External ear normal.  Eyes:     Conjunctiva/sclera: Conjunctivae normal.     Pupils: Pupils are equal, round, and reactive to light.  Neck:     Trachea: Phonation normal.  Cardiovascular:     Rate and Rhythm: Normal rate.  Pulmonary:     Effort: Pulmonary effort is normal.  Musculoskeletal:     Cervical back: Normal range of motion and neck supple.     Comments: Neck with decreased left lateral bending secondary to pain.  She guards against movement of the left shoulder secondary to left trapezius pain.  Skin:    General: Skin is warm and dry.  Neurological:     Mental Status: She is alert and oriented to person, place, and time.     Cranial Nerves: No cranial nerve deficit.     Sensory: No sensory deficit.     Motor: No abnormal muscle tone.     Coordination: Coordination normal.  Psychiatric:        Mood and Affect: Mood normal.        Behavior: Behavior normal.        Thought Content: Thought content normal.        Judgment: Judgment normal.     ED Results / Procedures / Treatments   Labs (all labs ordered are listed, but only abnormal results are displayed) Labs Reviewed - No data to display  EKG None  Radiology DG Cervical Spine Complete  Result Date: 05/21/2020 CLINICAL DATA:  Neck pain for 3 weeks without known injury. EXAM: CERVICAL SPINE - COMPLETE 4+ VIEW  COMPARISON:  None. FINDINGS: No fracture or spondylolisthesis is noted. Mild degenerative disc disease is noted at C5-6 with anterior osteophyte formation. Mild bilateral neural foraminal stenosis is noted at this level secondary to uncovertebral spurring. Remaining disc spaces are unremarkable. No prevertebral soft tissue swelling is noted. IMPRESSION: Mild degenerative disc disease is noted at C5-6 with mild bilateral neural foraminal stenosis at this level secondary to uncovertebral spurring. Electronically Signed   By: Lupita Raider M.D.   On: 05/21/2020 12:05    Procedures Procedures (including critical care time)  Medications Ordered in ED Medications - No data to display  ED Course  I have reviewed the triage vital signs and the nursing notes.  Pertinent labs & imaging results that were available during my care of the patient were reviewed by me and considered in my medical decision making (see chart for details).    MDM Rules/Calculators/A&P                           Patient Vitals for the past 24 hrs:  BP Temp Temp src Pulse Resp SpO2 Height Weight  05/21/20 1226 121/82 -- -- 69 18 100 % -- --  05/21/20 1120 -- -- -- -- -- -- 5\' 4"  (1.626 m) 99.8 kg  05/21/20 1119 140/83 97.8 F (36.6 C) Oral 71 18 100 % -- --    12:55 PM Reevaluation with update and discussion. After initial assessment and treatment, an updated evaluation reveals no change in clinical status, findings discussed and questions answered. 05/23/20   Medical Decision Making:  This patient is presenting for evaluation of neck pain without significant trauma, which does not require a range of treatment options, and is not a complaint that involves a high risk of morbidity and mortality. The differential diagnoses include radicular pain, muscle pain, arthritis. I decided to review old records, and in summary healthy young female with history of chronic drug use, receiving narcotics from multiple providers,  for stated lower back pain.  I did not require additional historical information from anyone.    Critical Interventions-clinical evaluation, discussion of findings and treatment plan  After These Interventions, the Patient was reevaluated and was found stable for discharge.  Doubt spinal myelopathy.  Symptomatic care indicated at this point  CRITICAL CARE-no Performed by: Mancel Bale  Nursing Notes Reviewed/ Care Coordinated Applicable Imaging Reviewed Interpretation of Laboratory Data incorporated into ED treatment  The patient appears reasonably screened and/or stabilized  for discharge and I doubt any other medical condition or other [facility] requiring further screening, evaluation, or treatment in the ED at this time prior to discharge.  Plan: Home Medications-continue routine medications, and use over-the-counter NSAID of choice; Home Treatments-heat to affected area; return here if the recommended treatment, does not improve the symptoms; Recommended follow up-PCP for follow-up care and consideration of MRI imaging to determine if there is a lesion causing this discomfort, and her cervical spine     Final Clinical Impression(s) / ED Diagnoses Final diagnoses:  Neck pain    Rx / DC Orders ED Discharge Orders         Ordered    methocarbamol (ROBAXIN) 500 MG tablet  2 times daily     Discontinue  Reprint     05/21/20 1253           Daleen Bo, MD 05/21/20 1257

## 2020-05-21 NOTE — Discharge Instructions (Addendum)
Use heat on the sore area of your neck and shoulder.  Take ibuprofen or Aleve, daily for 2 weeks to improve inflammation.  We are prescribing a muscle relaxer to improve your discomfort.  Do not drive or work when taking the muscle relaxer (methocarbamol).

## 2020-05-21 NOTE — ED Notes (Signed)
Pt attempted to sign x 3   Computer jumping to hyperdrive   Unable to sign on other computer due to signed in by this RN on computer spinning into hyperdrive

## 2020-05-21 NOTE — ED Triage Notes (Signed)
Awakened 3 weeks ago with stiff neck   Now into arm   Taking aleive without relief   Here for eval

## 2020-08-16 ENCOUNTER — Other Ambulatory Visit: Payer: Self-pay

## 2020-08-16 ENCOUNTER — Encounter (HOSPITAL_COMMUNITY): Payer: Self-pay | Admitting: Emergency Medicine

## 2020-08-16 DIAGNOSIS — N6452 Nipple discharge: Secondary | ICD-10-CM | POA: Diagnosis not present

## 2020-08-16 DIAGNOSIS — Z5321 Procedure and treatment not carried out due to patient leaving prior to being seen by health care provider: Secondary | ICD-10-CM | POA: Insufficient documentation

## 2020-08-16 NOTE — ED Triage Notes (Signed)
Pt c/o left breast drainage from nipple x one month.

## 2020-08-17 ENCOUNTER — Emergency Department (HOSPITAL_COMMUNITY)
Admission: EM | Admit: 2020-08-17 | Discharge: 2020-08-17 | Disposition: A | Discharge status: Home / Self Care | Payer: BLUE CROSS/BLUE SHIELD | Attending: Emergency Medicine | Admitting: Emergency Medicine

## 2020-08-18 ENCOUNTER — Other Ambulatory Visit: Payer: Self-pay

## 2020-08-18 ENCOUNTER — Encounter: Payer: Self-pay | Admitting: Emergency Medicine

## 2020-08-18 ENCOUNTER — Emergency Department
Admission: EM | Admit: 2020-08-18 | Discharge: 2020-08-18 | Disposition: A | Discharge status: Home / Self Care | Payer: BLUE CROSS/BLUE SHIELD | Attending: Emergency Medicine | Admitting: Emergency Medicine

## 2020-08-18 DIAGNOSIS — Z5321 Procedure and treatment not carried out due to patient leaving prior to being seen by health care provider: Secondary | ICD-10-CM | POA: Diagnosis not present

## 2020-08-18 DIAGNOSIS — N6452 Nipple discharge: Secondary | ICD-10-CM | POA: Insufficient documentation

## 2020-08-18 NOTE — ED Triage Notes (Signed)
Pt c/o left breast discharge x1 month. Pt describes fluid as "milk like" substance. Denies swelling, denies redness. Painful to the touch.

## 2020-08-18 NOTE — ED Notes (Signed)
No answer when called several times from lobby 

## 2020-09-01 ENCOUNTER — Encounter: Payer: Self-pay | Admitting: Emergency Medicine

## 2020-09-01 ENCOUNTER — Other Ambulatory Visit: Payer: Self-pay

## 2020-09-01 DIAGNOSIS — Z5321 Procedure and treatment not carried out due to patient leaving prior to being seen by health care provider: Secondary | ICD-10-CM | POA: Diagnosis not present

## 2020-09-01 DIAGNOSIS — N644 Mastodynia: Secondary | ICD-10-CM | POA: Insufficient documentation

## 2020-09-01 NOTE — ED Triage Notes (Signed)
Pt presents to ED with left breast pain for over a month. Intermittently has drainage from nipple that is "milky / yellow" in color. Denies swelling or redness. +Tenderness at the nipple. Pt reports having mastitis about 4 years ago and states this feels similar.

## 2020-09-02 ENCOUNTER — Telehealth: Payer: Self-pay | Admitting: Emergency Medicine

## 2020-09-02 ENCOUNTER — Emergency Department
Admission: EM | Admit: 2020-09-02 | Discharge: 2020-09-02 | Disposition: A | Discharge status: Home / Self Care | Payer: BLUE CROSS/BLUE SHIELD | Attending: Emergency Medicine | Admitting: Emergency Medicine

## 2020-09-02 NOTE — ED Notes (Signed)
No answer when called several times from lobby 

## 2020-09-02 NOTE — Telephone Encounter (Signed)
Called patient due to lwot to inquire about condition and follow up plans. Left message.   

## 2020-09-11 ENCOUNTER — Emergency Department
Admission: EM | Admit: 2020-09-11 | Discharge: 2020-09-11 | Disposition: A | Discharge status: Home / Self Care | Payer: BLUE CROSS/BLUE SHIELD | Attending: Student in an Organized Health Care Education/Training Program | Admitting: Student in an Organized Health Care Education/Training Program

## 2020-09-11 ENCOUNTER — Other Ambulatory Visit: Payer: Self-pay

## 2020-09-11 DIAGNOSIS — N644 Mastodynia: Secondary | ICD-10-CM | POA: Diagnosis not present

## 2020-09-11 DIAGNOSIS — S39012A Strain of muscle, fascia and tendon of lower back, initial encounter: Secondary | ICD-10-CM

## 2020-09-11 DIAGNOSIS — Y93F2 Activity, caregiving, lifting: Secondary | ICD-10-CM | POA: Diagnosis not present

## 2020-09-11 DIAGNOSIS — X500XXA Overexertion from strenuous movement or load, initial encounter: Secondary | ICD-10-CM | POA: Diagnosis not present

## 2020-09-11 DIAGNOSIS — Z91040 Latex allergy status: Secondary | ICD-10-CM | POA: Diagnosis not present

## 2020-09-11 DIAGNOSIS — F17210 Nicotine dependence, cigarettes, uncomplicated: Secondary | ICD-10-CM | POA: Insufficient documentation

## 2020-09-11 DIAGNOSIS — S3992XA Unspecified injury of lower back, initial encounter: Secondary | ICD-10-CM | POA: Diagnosis present

## 2020-09-11 MED ORDER — HYDROMORPHONE HCL 1 MG/ML IJ SOLN
1.0000 mg | Freq: Once | INTRAMUSCULAR | Status: AC
  Administered 2020-09-11: 1 mg via INTRAMUSCULAR
  Filled 2020-09-11: qty 1

## 2020-09-11 MED ORDER — ORPHENADRINE CITRATE ER 100 MG PO TB12
100.0000 mg | ORAL_TABLET | Freq: Two times a day (BID) | ORAL | 0 refills | Status: DC
Start: 2020-09-11 — End: 2021-01-30

## 2020-09-11 MED ORDER — CEPHALEXIN 500 MG PO CAPS: 500.0000 mg | ORAL_CAPSULE | Freq: Four times a day (QID) | ORAL | 0 refills | Status: AC

## 2020-09-11 MED ORDER — ORPHENADRINE CITRATE 30 MG/ML IJ SOLN
60.0000 mg | Freq: Two times a day (BID) | INTRAMUSCULAR | Status: DC
  Administered 2020-09-11: 60 mg via INTRAMUSCULAR
  Filled 2020-09-11: qty 2

## 2020-09-11 NOTE — Discharge Instructions (Addendum)
Follow-up with treating doctor for chronic back pain management.  Continue previous pain medication.  Start Norflex and naproxen as directed.  Advised [facility] for further evaluation of your left breast pain while taking Keflex.

## 2020-09-11 NOTE — ED Provider Notes (Signed)
[facility] Emergency Department Provider Note   ____________________________________________   First MD Initiated Contact with Patient 09/11/20 1223     (approximate)  I have reviewed the triage vital signs and the nursing notes.   HISTORY  Chief Complaint Back Pain    HPI Madison Blackburn is a 44 y.o. female patient complaining of low back pain for 1 week.  Patient that she believes is secondary to her job requiring repetitive heavy lifting.  Patient has a history of degenerative disc disease in the cervical lumbar spine.  Patient is followed by pain management.  Patient next appointment is done to the 28th of this month.  Patient denies radicular component to her back pain.  Patient denies bladder bowel dysfunction.  Patient also wished to address left breast pain.  Patient complaints is similar to her diagnosis of mastitis.  Patient rates her back pain as 7/10.  Patient described the pain as "achy".  Patient takes daily doses of Percocets which she said is not helping.         Past Medical History:  Diagnosis Date  . DDD (degenerative disc disease), lumbar   . DDD (degenerative disc disease), lumbar   . Sciatica   . Scoliosis     There are no problems to display for this patient.   Past Surgical History:  Procedure Laterality Date  . CHOLECYSTECTOMY      Prior to Admission medications   Medication Sig Start Date End Date Taking? Authorizing Provider  cephALEXin (KEFLEX) 500 MG capsule Take 1 capsule (500 mg total) by mouth 4 (four) times daily. 01/03/20   Triplett, Tammy, PA-C  cephALEXin (KEFLEX) 500 MG capsule Take 1 capsule (500 mg total) by mouth 4 (four) times daily for 10 days. 09/11/20 09/21/20  Joni Reining, PA-C  HYDROcodone-acetaminophen (NORCO) 5-325 MG tablet Take 1 tablet by mouth every 6 (six) hours as needed for moderate pain. 12/08/19   Elvina Sidle, MD  methocarbamol (ROBAXIN) 500 MG tablet Take 1 tablet (500 mg total) by  mouth 2 (two) times daily. 05/21/20   Mancel Bale, MD  orphenadrine (NORFLEX) 100 MG tablet Take 1 tablet (100 mg total) by mouth 2 (two) times daily. 09/11/20   Joni Reining, PA-C  phenazopyridine (PYRIDIUM) 200 MG tablet Take 1 tablet (200 mg total) by mouth 3 (three) times daily. 01/03/20   Triplett, Tammy, PA-C    Allergies Celebrex [celecoxib] and Latex  Family History  Family history unknown: Yes    Social History Social History   Tobacco Use  . Smoking status: Current Some Day Smoker    Packs/day: 0.50    Types: Cigarettes  . Smokeless tobacco: Never Used  Vaping Use  . Vaping Use: Never used  Substance Use Topics  . Alcohol use: No  . Drug use: No    Review of Systems Constitutional: No fever/chills Eyes: No visual changes. ENT: No sore throat. Cardiovascular: Denies chest pain. Respiratory: Denies shortness of breath. Gastrointestinal: No abdominal pain.  No nausea, no vomiting.  No diarrhea.  No constipation. Genitourinary: Negative for dysuria. Musculoskeletal: Positive for back pain. Skin: Negative for rash. Neurological: Negative for headaches, focal weakness or numbness.  Allergic/Immunilogical: Celebrex and latex.  ____________________________________________   PHYSICAL EXAM:  VITAL SIGNS: ED Triage Vitals  Enc Vitals Group     BP 09/11/20 1211 124/77     Pulse Rate 09/11/20 1211 78     Resp 09/11/20 1211 18     Temp 09/11/20 1211  98.6 F (37 C)     Temp Source 09/11/20 1211 Oral     SpO2 09/11/20 1211 98 %     Weight 09/11/20 1209 200 lb (90.7 kg)     Height 09/11/20 1209 5\' 4"  (1.626 m)     Head Circumference --      Peak Flow --      Pain Score 09/11/20 1209 10     Pain Loc --      Pain Edu? --      Excl. in GC? --    Constitutional: Alert and oriented. Well appearing and in no acute distress. Eyes: Conjunctivae are normal. PERRL. EOMI. Head: Atraumatic. Nose: No congestion/rhinnorhea. Mouth/Throat: Mucous membranes are moist.   Oropharynx non-erythematous. Neck: No cervical spine tenderness to palpation. Hematological/Lymphatic/Immunilogical: No cervical lymphadenopathy. Cardiovascular: Normal rate, regular rhythm. Grossly normal heart sounds.  Good peripheral circulation. Respiratory: Normal respiratory effort.  No retractions. Lungs CTAB. Gastrointestinal: Soft and nontender. No distention. No abdominal bruits. No CVA tenderness. Genitourinary: Deferred Musculoskeletal: No lower extremity tenderness nor edema.  No joint effusions. Neurologic:  Normal speech and language. No gross focal neurologic deficits are appreciated. No gait instability. Skin: No obvious edema erythema to the left breast.  No discharge.  Patient is tender to palpation.   Psychiatric: Mood and affect are normal. Speech and behavior are normal.  ____________________________________________   LABS (all labs ordered are listed, but only abnormal results are displayed)  Labs Reviewed - No data to display ____________________________________________  EKG   ____________________________________________  RADIOLOGY I, 09/13/20, personally viewed and evaluated these images (plain radiographs) as part of my medical decision making, as well as reviewing the written report by the radiologist.  ED MD interpretation:    Official radiology report(s): No results found.  ____________________________________________   PROCEDURES  Procedure(s) performed (including Critical Care):  Procedures   ____________________________________________   INITIAL IMPRESSION / ASSESSMENT AND PLAN / ED COURSE  As part of my medical decision making, I reviewed the following data within the electronic MEDICAL RECORD NUMBER        Patient presents with 1 week of increasing back pain.  Patient state her job requires repetitive heavy lifting.  Patient history of degenerative disc disease and followed under pain management.  Patient also suspects she might  developing mastitis of the left breast.  Patient exam is consistent with left breast tenderness and muscle skeletal pain in the lumbar spine.  Patient given a prescription for Keflex, naproxen, and Norflex.  Patient advised to follow-up with pain management and [facility] for further evaluation and treatment.       ____________________________________________   FINAL CLINICAL IMPRESSION(S) / ED DIAGNOSES  Final diagnoses:  Strain of lumbar region, initial encounter  Breast pain, left     ED Discharge Orders         Ordered    cephALEXin (KEFLEX) 500 MG capsule  4 times daily        09/11/20 1248    orphenadrine (NORFLEX) 100 MG tablet  2 times daily        09/11/20 1248          *Please note:  Asiya L Bhola was evaluated in Emergency Department on 09/11/2020 for the symptoms described in the history of present illness. She was evaluated in the context of the global COVID-19 pandemic, which necessitated consideration that the patient might be at risk for infection with the SARS-CoV-2 virus that causes COVID-19. Institutional protocols and algorithms that  pertain to the evaluation of patients at risk for COVID-19 are in a state of rapid change based on information released by regulatory bodies including the CDC and federal and state organizations. These policies and algorithms were followed during the patient's care in the ED.  Some ED evaluations and interventions may be delayed as a result of limited staffing during and the pandemic.*   Note:  This document was prepared using Dragon voice recognition software and may include unintentional dictation errors.    Joni Reining, PA-C 09/11/20 1251    Willy Eddy, MD 09/11/20 1517

## 2020-09-11 NOTE — ED Triage Notes (Signed)
Pt states that she has been having lower back pain x1 week- pt states she does a lot of heavy lifting at work

## 2020-09-11 NOTE — ED Notes (Signed)
Pt presents to the ED for lower back pain for the past week. 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 Pt also having recurrent breast inflammation and d/c that pt states she has been seen for recently. Pt is A&Ox4 and NAD. Ambulatory to room

## 2021-01-30 ENCOUNTER — Emergency Department
Admission: EM | Admit: 2021-01-30 | Discharge: 2021-01-30 | Disposition: A | Discharge status: Home / Self Care | Payer: Self-pay | Attending: Emergency Medicine | Admitting: Emergency Medicine

## 2021-01-30 ENCOUNTER — Other Ambulatory Visit: Payer: Self-pay

## 2021-01-30 ENCOUNTER — Emergency Department: Payer: Self-pay

## 2021-01-30 DIAGNOSIS — M25562 Pain in left knee: Secondary | ICD-10-CM | POA: Diagnosis not present

## 2021-01-30 DIAGNOSIS — Z91040 Latex allergy status: Secondary | ICD-10-CM | POA: Insufficient documentation

## 2021-01-30 DIAGNOSIS — F17210 Nicotine dependence, cigarettes, uncomplicated: Secondary | ICD-10-CM | POA: Insufficient documentation

## 2021-01-30 MED ORDER — MELOXICAM 15 MG PO TABS
ORAL_TABLET | ORAL | 0 refills | Status: AC
Start: 2021-01-30 — End: ?

## 2021-01-30 MED ORDER — MELOXICAM 15 MG PO TABS
ORAL_TABLET | ORAL | 0 refills | Status: DC
Start: 2021-01-30 — End: 2021-01-30

## 2021-01-30 NOTE — ED Triage Notes (Signed)
Pt states she fell about 2 weeks ago and injured her left knee and has been having some pain and swelling since, pt is ambulatory to triage with a steady gait

## 2021-01-30 NOTE — ED Provider Notes (Signed)
[facility] Emergency Department Provider Note   ____________________________________________   Event Date/Time   First MD Initiated Contact with Patient 01/30/21 1009     (approximate)  I have reviewed the triage vital signs and the nursing notes.   HISTORY  Chief Complaint Knee Injury   HPI Madison Blackburn is a 45 y.o. female presents to the ED with complaint of left knee pain after falling 2 weeks ago.  Patient states she has continued to ambulate but still continues to have pain.  Denies any head injury or LOC with her fall.  She has been taking over-the-counter anti-inflammatories without any relief.  She rates her pain as 3 out of 10.       Past Medical History:  Diagnosis Date  . DDD (degenerative disc disease), lumbar   . DDD (degenerative disc disease), lumbar   . Sciatica   . Scoliosis     There are no problems to display for this patient.   Past Surgical History:  Procedure Laterality Date  . CHOLECYSTECTOMY      Prior to Admission medications   Medication Sig Start Date End Date Taking? Authorizing Provider  HYDROcodone-acetaminophen (NORCO) 5-325 MG tablet Take 1 tablet by mouth every 6 (six) hours as needed for moderate pain. 12/08/19   Elvina Sidle, MD  meloxicam (MOBIC) 15 MG tablet 1 daily with food for inflammation. 01/30/21   Tommi Rumps, PA-C  methocarbamol (ROBAXIN) 500 MG tablet Take 1 tablet (500 mg total) by mouth 2 (two) times daily. 05/21/20   Mancel Bale, MD  phenazopyridine (PYRIDIUM) 200 MG tablet Take 1 tablet (200 mg total) by mouth 3 (three) times daily. 01/03/20   Triplett, Tammy, PA-C    Allergies Celebrex [celecoxib] and Latex  Family History  Family history unknown: Yes    Social History Social History   Tobacco Use  . Smoking status: Current Some Day Smoker    Packs/day: 0.50    Types: Cigarettes  . Smokeless tobacco: Never Used  Vaping Use  . Vaping Use: Never used  Substance Use Topics   . Alcohol use: No  . Drug use: No    Review of Systems Constitutional: No fever/chills Eyes: No visual changes. Cardiovascular: Denies chest pain. Respiratory: Denies shortness of breath. Gastrointestinal: No abdominal pain.  No nausea, no vomiting.  Musculoskeletal: Positive for left knee pain. Skin: Negative for rash. Neurological: Negative for headaches, focal weakness or numbness. ____________________________________________   PHYSICAL EXAM:  VITAL SIGNS: ED Triage Vitals  Enc Vitals Group     BP 01/30/21 0932 117/71     Pulse Rate 01/30/21 0932 63     Resp 01/30/21 0932 18     Temp 01/30/21 0932 98.1 F (36.7 C)     Temp Source 01/30/21 0932 Oral     SpO2 01/30/21 0932 100 %     Weight 01/30/21 0917 220 lb (99.8 kg)     Height 01/30/21 0917 5\' 4"  (1.626 m)     Head Circumference --      Peak Flow --      Pain Score 01/30/21 0917 3     Pain Loc --      Pain Edu? --      Excl. in GC? --     Constitutional: Alert and oriented. Well appearing and in no acute distress. Eyes: Conjunctivae are normal.  Head: Atraumatic. Neck: No stridor.   Cardiovascular: Normal rate, regular rhythm. Grossly normal heart sounds.  Good peripheral circulation. Respiratory:  Normal respiratory effort.  No retractions. Lungs CTAB. Musculoskeletal: Examination of her left knee there is no gross deformity however there is tenderness on palpation of the patella.  Minimal to no crepitus is noted with range of motion however this does increase her pain.  Ligaments are stable medially but laterally has minimal instability.  Skin is intact. Neurologic:  Normal speech and language. No gross focal neurologic deficits are appreciated.  Skin:  Skin is warm, dry and intact. No rash noted. Psychiatric: Mood and affect are normal. Speech and behavior are normal.  ____________________________________________   LABS (all labs ordered are listed, but only abnormal results are displayed)  Labs Reviewed  - No data to display ____________________________________________ ____________________________________________  RADIOLOGY Beaulah Corin, personally viewed and evaluated these images (plain radiographs) as part of my medical decision making, as well as reviewing the written report by the radiologist.   Official radiology report(s): DG Knee Complete 4 Views Left  Result Date: 01/30/2021 CLINICAL DATA:  Acute LEFT knee pain following fall 2 weeks ago. Initial encounter. EXAM: LEFT KNEE - COMPLETE 4+ VIEW COMPARISON:  04/04/2020 radiograph FINDINGS: No evidence of fracture, dislocation, or joint effusion. No evidence of arthropathy or other focal bone abnormality. Soft tissues are unremarkable. IMPRESSION: Negative. Electronically Signed   By: Harmon Pier M.D.   On: 01/30/2021 10:40    ____________________________________________   PROCEDURES  Procedure(s) performed (including Critical Care):  Procedures  Knee immobilizer was applied by ED staff. ____________________________________________   INITIAL IMPRESSION / ASSESSMENT AND PLAN / ED COURSE  As part of my medical decision making, I reviewed the following data within the electronic MEDICAL RECORD NUMBER Notes from prior ED visits and [place] Controlled Substance Database  45 year old female presents to the ED with complaint of continued left knee pain after falling 2 weeks ago.  Patient has continued to ambulate and has been taking anti-inflammatories sporadically.  Patient was reassured that x-rays were negative for acute bony injury.  She was placed in a knee immobilizer and encouraged to wear this for 1 to 2 weeks while walking.  A prescription for meloxicam was sent to the pharmacy.  She is to follow-up with orthopedics if any continued problems or not improving.  ____________________________________________   FINAL CLINICAL IMPRESSION(S) / ED DIAGNOSES  Final diagnoses:  Acute pain of left knee     ED Discharge Orders          Ordered    meloxicam (MOBIC) 15 MG tablet  Status:  Discontinued        01/30/21 1119    meloxicam (MOBIC) 15 MG tablet        01/30/21 1120          *Please note:  Chassie L Ulysse was evaluated in Emergency Department on 01/30/2021 for the symptoms described in the history of present illness. She was evaluated in the context of the global COVID-19 pandemic, which necessitated consideration that the patient might be at risk for infection with the SARS-CoV-2 virus that causes COVID-19. Institutional protocols and algorithms that pertain to the evaluation of patients at risk for COVID-19 are in a state of rapid change based on information released by regulatory bodies including the CDC and federal and state organizations. These policies and algorithms were followed during the patient's care in the ED.  Some ED evaluations and interventions may be delayed as a result of limited staffing during and the pandemic.*   Note:  This document was prepared using Conservation officer, historic buildings  and may include unintentional dictation errors.    Tommi Rumps, PA-C 01/30/21 1133    Phineas Semen, MD 01/30/21 1332

## 2021-01-30 NOTE — Discharge Instructions (Addendum)
Follow-up with your primary care provider or Dr. Odis Luster who is on-call for orthopedics if not improving.  Wear knee immobilizer when walking for the next 1 to 2 weeks to see if this helps with your knee pain.  If not improving you will need to see the orthopedist.  Begin taking meloxicam with food 1 tablet/day for inflammation and pain.  You may continue taking your hydrocodone with this.  Ice and elevate your knee as needed

## 2021-01-30 NOTE — ED Notes (Signed)
Patient states the pain was severe and stabbing the other night. Patient states she can't put weight on left knee to transition from sitting to standing.

## 2021-07-09 ENCOUNTER — Other Ambulatory Visit: Payer: Self-pay

## 2021-07-09 ENCOUNTER — Emergency Department
Admission: EM | Admit: 2021-07-09 | Discharge: 2021-07-09 | Disposition: A | Discharge status: Home / Self Care | Payer: MEDICAID | Attending: Emergency Medicine | Admitting: Emergency Medicine

## 2021-07-09 DIAGNOSIS — Z888 Allergy status to other drugs, medicaments and biological substances status: Secondary | ICD-10-CM | POA: Insufficient documentation

## 2021-07-09 DIAGNOSIS — M549 Dorsalgia, unspecified: Secondary | ICD-10-CM | POA: Insufficient documentation

## 2021-07-09 DIAGNOSIS — M545 Low back pain, unspecified: Secondary | ICD-10-CM

## 2021-07-09 DIAGNOSIS — Z886 Allergy status to analgesic agent status: Secondary | ICD-10-CM | POA: Insufficient documentation

## 2021-07-09 DIAGNOSIS — R110 Nausea: Secondary | ICD-10-CM

## 2021-07-09 DIAGNOSIS — G8929 Other chronic pain: Secondary | ICD-10-CM | POA: Insufficient documentation

## 2021-07-09 LAB — UR DRUGS OF ABUSE SCREEN
Amphetamines Screen: NEGATIVE
Barbiturates Screen: NEGATIVE
Benzodiazepine Screen: NEGATIVE
Cocaine Screen: NEGATIVE
Methadone Screen: NEGATIVE
Opiates Screen: NEGATIVE
Phencyclidine Screen: NEGATIVE
UR Fentanyl Screen: NEGATIVE

## 2021-07-09 MED ORDER — IBUPROFEN 600 MG OR TABS
600.00 mg | ORAL_TABLET | Freq: Four times a day (QID) | ORAL | 0 refills | Status: AC | PRN
Start: 2021-07-09 — End: 2021-07-24
  Filled 2021-07-09: qty 60, 15d supply, fill #0

## 2021-07-09 MED ORDER — LIDOCAINE 4 % EX PTCH
1.00 | MEDICATED_PATCH | CUTANEOUS | 0 refills | Status: AC
Start: 2021-07-09 — End: ?
  Filled 2021-07-09: qty 5, 5d supply, fill #0

## 2021-07-09 MED ORDER — FAMOTIDINE 20 MG OR TABS
20.00 mg | ORAL_TABLET | Freq: Once | ORAL | Status: AC
Start: 2021-07-09 — End: 2021-07-09
  Administered 2021-07-09 (×2): 20 mg via ORAL
  Filled 2021-07-09: qty 1

## 2021-07-09 MED ORDER — LIDOCAINE 5 % EX PTCH
1.00 | MEDICATED_PATCH | CUTANEOUS | Status: DC
Start: 2021-07-09 — End: 2021-07-09

## 2021-07-09 MED ORDER — ACETAMINOPHEN 325 MG PO TABS
650.0000 mg | ORAL_TABLET | Freq: Four times a day (QID) | ORAL | Status: DC | PRN
Start: 2021-07-09 — End: 2021-07-09
  Administered 2021-07-09 (×2): 650 mg via ORAL
  Filled 2021-07-09: qty 2

## 2021-07-09 MED ORDER — OXYCODONE-ACETAMINOPHEN 5-325 MG OR TABS
1.00 | ORAL_TABLET | Freq: Every day | ORAL | 0 refills | Status: AC | PRN
Start: 2021-07-09 — End: ?
  Filled 2021-07-09: qty 11, 11d supply, fill #0

## 2021-07-09 MED ORDER — IBUPROFEN 400 MG OR TABS
400.00 mg | ORAL_TABLET | Freq: Once | ORAL | Status: AC
Start: 2021-07-09 — End: 2021-07-09
  Administered 2021-07-09 (×2): 400 mg via ORAL
  Filled 2021-07-09: qty 1

## 2021-07-09 MED ORDER — LIDOCAINE 4 % EX PTCH
1.0000 | MEDICATED_PATCH | Freq: Once | CUTANEOUS | Status: DC
Start: 2021-07-09 — End: 2021-07-09
  Administered 2021-07-09 (×2): 1 via TRANSDERMAL
  Filled 2021-07-09: qty 1

## 2021-07-09 MED ORDER — ACETAMINOPHEN 325 MG PO TABS
650.00 mg | ORAL_TABLET | Freq: Four times a day (QID) | ORAL | 0 refills | Status: AC | PRN
Start: 2021-07-09 — End: ?
  Filled 2021-07-09: qty 60, 8d supply, fill #0

## 2021-07-09 MED ORDER — FAMOTIDINE 20 MG OR TABS
20.00 mg | ORAL_TABLET | Freq: Two times a day (BID) | ORAL | 0 refills | Status: AC
Start: 2021-07-09 — End: ?
  Filled 2021-07-09: qty 30, 15d supply, fill #0

## 2021-07-09 MED ORDER — OXYCODONE-ACETAMINOPHEN 5-325 MG OR TABS
1.00 | ORAL_TABLET | Freq: Once | ORAL | Status: AC
Start: 2021-07-09 — End: 2021-07-09
  Administered 2021-07-09 (×2): 1 via ORAL
  Filled 2021-07-09: qty 1

## 2021-07-09 MED ORDER — ONDANSETRON 4 MG OR TBDP
4.00 mg | ORAL_TABLET | Freq: Three times a day (TID) | ORAL | 0 refills | Status: AC | PRN
Start: 2021-07-09 — End: ?
  Filled 2021-07-09: qty 15, 5d supply, fill #0

## 2021-07-09 NOTE — Discharge Instructions (Signed)
You were seen in the emergency department for your chronic back pain.  We have come up with a multimodal pain medication regimen to get you to your PCP visit on August 24th.  You can use 1 lidocaine patch every 24 hours, 1 Percocet pill per day (please use this to get your grocery shopping etc. done).  Please maximize use of ibuprofen and Tylenol, you can use up to 1000 mg of Tylenol at a time, please never take more than 4000 mg of Tylenol in any 24 hour period, as this can be very damaging to her liver.  Please take the Pepcid with ibuprofen to prevent nausea, and take Zofran as needed for nausea.  Please make sure to follow-up with your primary care provider on the 24th at [facility].  As we discussed, it is unlikely that an emergency department provider will give you more Percocet so it is very important to stretch this out to your primary care visit.

## 2021-07-09 NOTE — ED Provider Notes (Signed)
The Date of Service for the Emergency Room encounter is 07/09/2021  8:55 AM   Patient: Kathy Keith, MRN [id number], DOB 30-May-1976  Primary MD: Robet Leu, [facility]  Chief Complaint   Patient presents with    Back Pain     Hx back pain, recently moved back from [facility] and is trying to reestablish primary care.  Her old primary in [place] is no longer there.  Was seeing pain management in [place].  Taking  oxycodone 10 mg.          HPI: Kathy Keith is a 45 year old female with PMHx significant for chronic back pain previously on 10 mg oxy 4 times per day as needed, smoker, presenting with uncontrolled chronic back pain.  She recently moved to [place] from [place], where she followed with a primary care and was seeing pain management and receiving oxycodone 10 mg 4 times per day.  Subsequently on 08/08 she was seen [facility], given 15 tablets Percocet.  She used all of those and 4 days and re-presented to [facility] health yesterday, was sent home without any further opioid prescriptions.  She is now presenting here in continued pain.  She is usually able to get groceries, walk around her house and go about her daily activities however without the Percocet she is not able to do any of these things.  She has an appointment set up at [facility] on the 24th of August, where she is establishing care with a primary care provider.  She was previously living in [place] [facility] and follow-up with him [facility] that time.    Query to cures confirms she was provided with Percocet 15 tablets at [facility] on 08/08    Back pain has been present for many years, no history of trauma or signature pre however she has had imaging done and has multiple degenerative deformities but she perseverates on to some extent.    Denies recent history of trauma. Denies fevers, chills, weight loss. Denies history of pulsatile abdominal mass. Patient denies motor or sensory changes. Symptoms are not bilateral.  Denies urinary or bowel incontinence. Denies urinary retention. Denies history of malignancy, TB. Denies history of chronic steroid use. Patient denies history of IVDU. Patient has no indwelling vascular catheters.    Patient denies headache, changes in vision, neck pain, shortness of breath, chest pain, cough, nausea, vomiting, diaphoresis, abdominal pain, constipation, diarrhea, urinary symptoms.      Review of Systems   All other systems reviewed and negative unless otherwise noted in the HPI or above. This was done per my custom and practice for systems appropriate to the chief complaint in an emergency department setting and varies depending on the quality of history that the patient is able to provide.    Home Medications:  None       Allergies:Celebrex [celecoxib] and Ibuprofen    Past Medical & Surgical History:No past medical history on file.No past surgical history on file.    Family History:  Family History:  For past Medical/Surgical/Social/Family History, refer to HPI, unless explicitly noted here.  No family history on file.    Social History:  Social History     Socioeconomic History    Marital status: Married     Denies alcohol, smoking, other drug use besides percocet and tried thc for pain    Physical Exam -- Nursing note and vitals reviewed.   Vitals:    07/09/21 0845  BP: 152/99   Pulse: 74   Resp: 16   Temp: 97.9 F (36.6 C)   SpO2: 100%   Weight: 98 kg (216 lb)   Height: 5\' 4"  (1.626 m)       Constitutional: No acute distress, well appearing  HEENT: Atraumatic, no diaphoresis, no conjunctival injection, no scleral icterus  Neck: Ranging comfortably, no asymmetry, no point TTP  Resp: Lungs CTAB, no crackles, wheezing, rhonchi. No respiratory distress on room air. Speaking full, complete sentences.  CV: RRR, no abnormal heart sounds. Distal pulses intact. No dependent edema.  Abdominal: Soft, diffusely non-tender in 4 quadrants and suprapubic area, non-distended, no rebound or guarding.  Back:  No CVA tenderness.  Mild tenderness to palpation at lower lumbar/sacral area, primarily paraspinal.  Patient says this is chronic.  MSK: No TTP to extremities. No gross deformities.  Skin: Warm, dry. No rashes or lesions in exposed regions.   Neuro: Speech normal. Strength 5/5 with flexion and extension at ankles, knees, shoulders, elbows, wrists, grasp strength and hip flexion bilaterally. SILT in all extremities anterior and posterior aspects distal and proximal, neg pronator drift. Ambulates with steady antalgic gait.   Psych: A&Ox4. Mentation normal, behavior appropriate         Results for orders placed or performed during the hospital encounter of 07/09/21   UR Drugs of Abuse Screen   Result Value Ref Range    Amphetamines Screen Negative Negative    Barbiturates Screen Negative Negative    Cocaine Screen Negative Negative    UR Fentanyl Screen Negative Negative    Benzodiazepine Screen Negative Negative    Methadone Screen Negative Negative    Opiates Screen Negative Negative    Oxycodone Screen Pending confirm Negative    Phencyclidine Screen Negative Negative    THC Screen Pending confirm Negative    UR Drug Screen Interpretation See Comment Negative       No orders to display       Medical Decision Making  Pt is a 45 year old female with history as above who presents with chronic back pain, recently moved from [place] to [place]. Her pain is chronic without any red flag sx for acute spinal issues or any recent trauma. Normal neuro exam and ambulatory with minimal low back ttp that is same as usual per pt.   Spoke to pt for >20 mins re: history of attempts to control pain w/ many different muscle relaxors, antiinflammatory medications and the only thing that works is oxy. She has been using this in isolation. Has not used Xanaflex that she recent got from ED as it does not do anything.   Discussed that she is not going to get her prior 10 mg oxy QID in [place], even from PCP and that we need to come up  with other strategies. She is open to to trying multimodal approach.  Antiinflammatory medications do help but she does not use them due to nausea.  Queried CURES which is consistent w/ pt reported scripts and notes, although can't see hx in [place].  Will check Utox.  Today we will try tylenol, lidocaine, 1 percocet 5mg , famotidine for nausea and ibuprofen.   Pt feeling better upon reassessment, okay with DC plan of 1 percocet per day plus other meds and zofran for prn nausea control from ibuprofen, she has pcp appt on 8/24 and understands it is unlikely that an ED provider will give her more opioids prior to this. Goal is to maximize function rather  than be pain free.  Has Narcan.  Will refer to pain clinic so that she can set up appt in case she wants this after seeing PCP.    Orders Placed This Encounter   Procedures    CURES Review Documentation - I Reviewed CURES    UR Drugs of Abuse Screen    Consult/Referral to Pain Clinic     Medications   oxyCODONE-acetaminophen (PERCOCET) 5-325 MG tablet 1 tablet (1 tablet Oral Given 07/09/21 1002)   ibuprofen (MOTRIN) tablet 400 mg (400 mg Oral Given 07/09/21 1003)   famotidine (PEPCID) tablet 20 mg (20 mg Oral Given 07/09/21 1003)   pt also received lidocaine patch and tylenol 650mg          Patient seen and discussed with ED attending, Dr.    Written by Bryson Ha, MD  [facility] Resident PGY-2     Evangaline Jou, Tomma Lightning., MD  Resident  07/10/21 1509       07/12/21, MD  07/14/21 1737

## 2021-07-09 NOTE — ED Notes (Signed)
Discharge instructions provided to patient at bedside. Pt a&ox4 awake and alert answering all questions appropriately. Pt ambulatory with steady gait. Pt verbalizes understanding of discharge instructions and will follow up with pmd. Pt verbalizes understanding to return to the ED for worsening symptoms. Pt has no further questions at this time. Patient declined final VS     -AVS paperwork signed by the patient and placed in medical records container at the back nurses station

## 2021-07-12 LAB — CONFIRM CANNABINOIDS-URINE BY LC-MSMS, QUALITATIVE: Conf. THC: POSITIVE ng/mL — AB

## 2021-07-13 LAB — OPIATES, URN, QUANT
6-acetylmorphine, Urn, Quant: <10 ng/mL
Codeine, Urn, Quant: <20 ng/mL
Hydrocodone, Urn, Quant: <20 ng/mL
Hydromorphone, Urn, Quant: <20 ng/mL
Morphine, Urn, Quant: <20 ng/mL
Norhydrocodone, Urn, Quant: <20 ng/mL
Noroxycodone, Urn, Quant: 77 ng/mL
Noroxymorphone, Urn, Quant: 24 ng/mL
Oxycodone, Urn, Quant: <20 ng/mL
Oxymorphone, Urn, Quant: <20 ng/mL

## 2022-06-14 IMAGING — DX DG KNEE COMPLETE 4+V*L*
4 series · 4 of 4 positions shown · non-contrast
Comparison: 04/04/2020 radiograph

CLINICAL DATA: Acute LEFT knee pain following fall 2 weeks ago.
Initial encounter.

EXAM:
LEFT KNEE - COMPLETE 4+ VIEW

[knee ap]
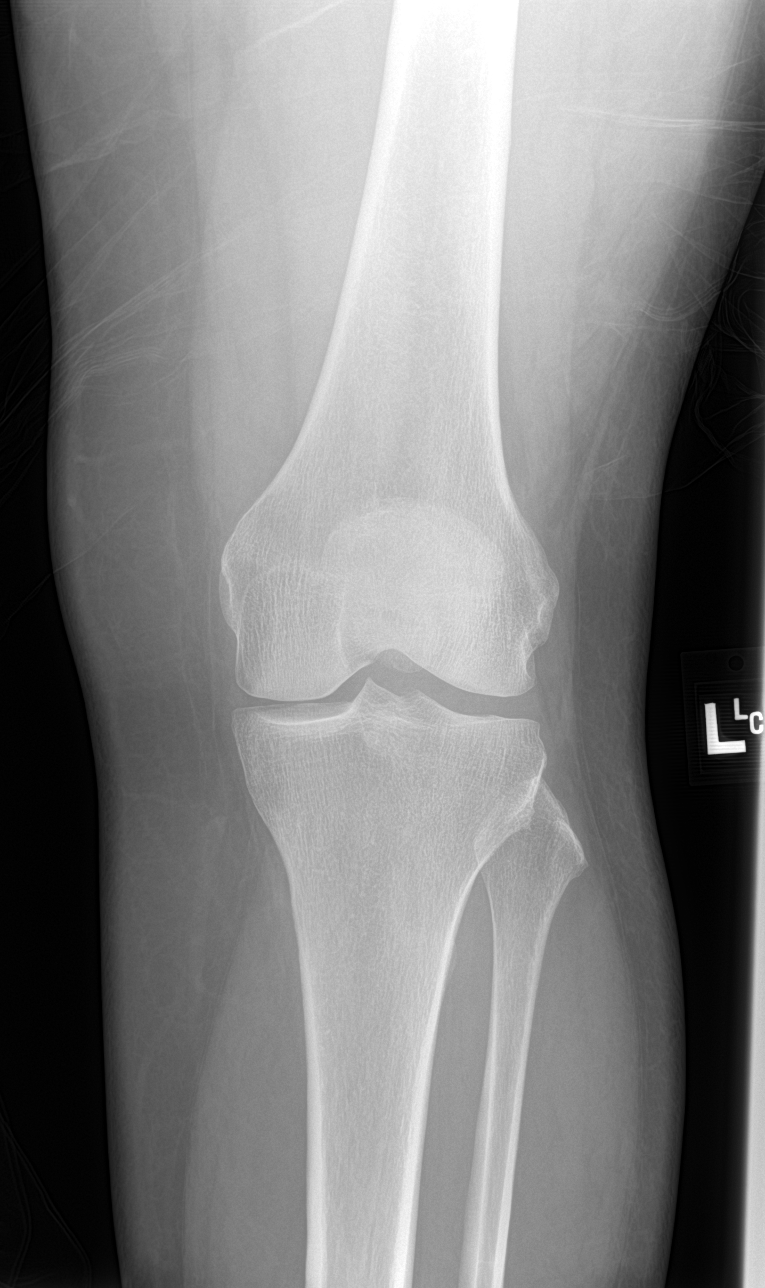

[knee lat]
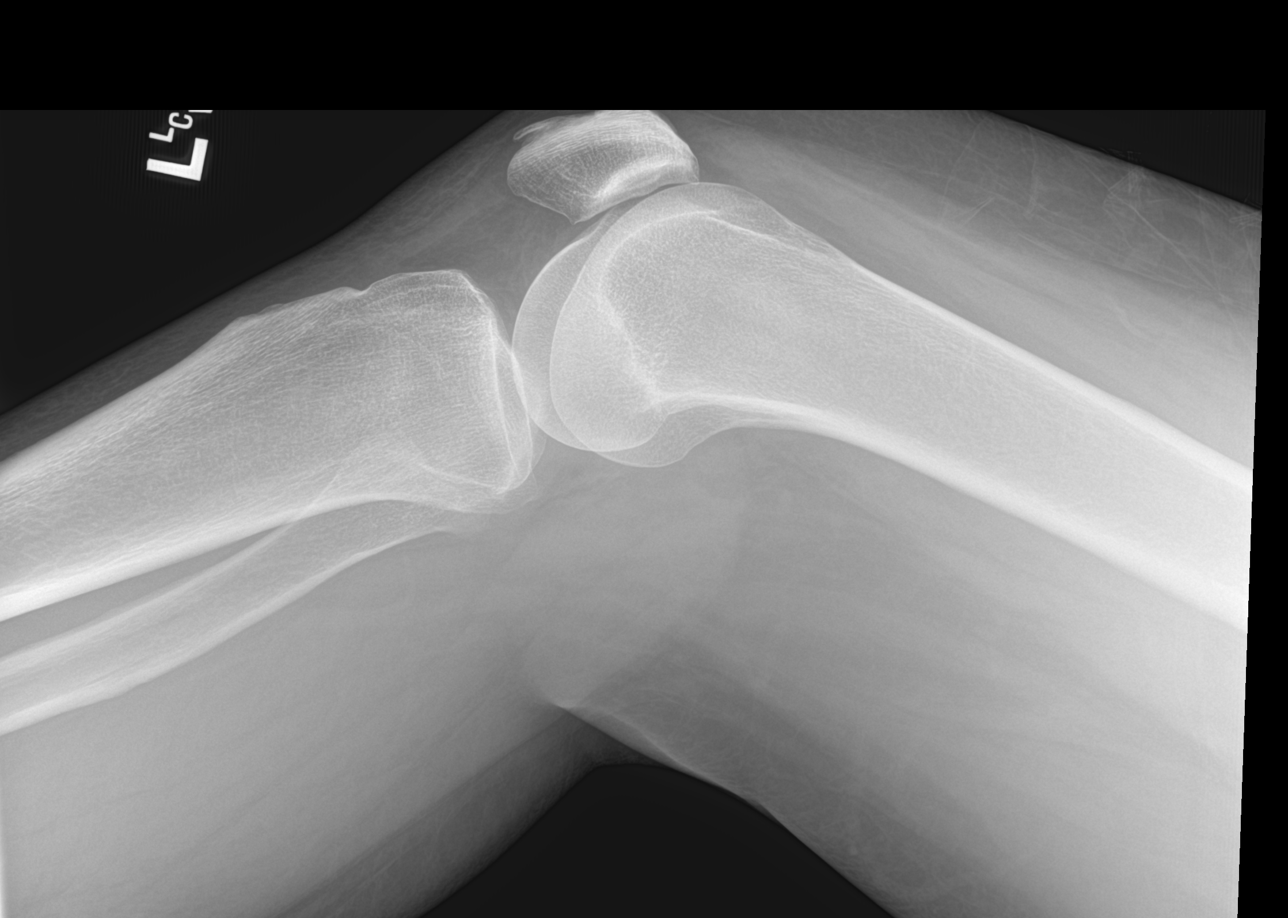

[knee obl (1 of 2)]
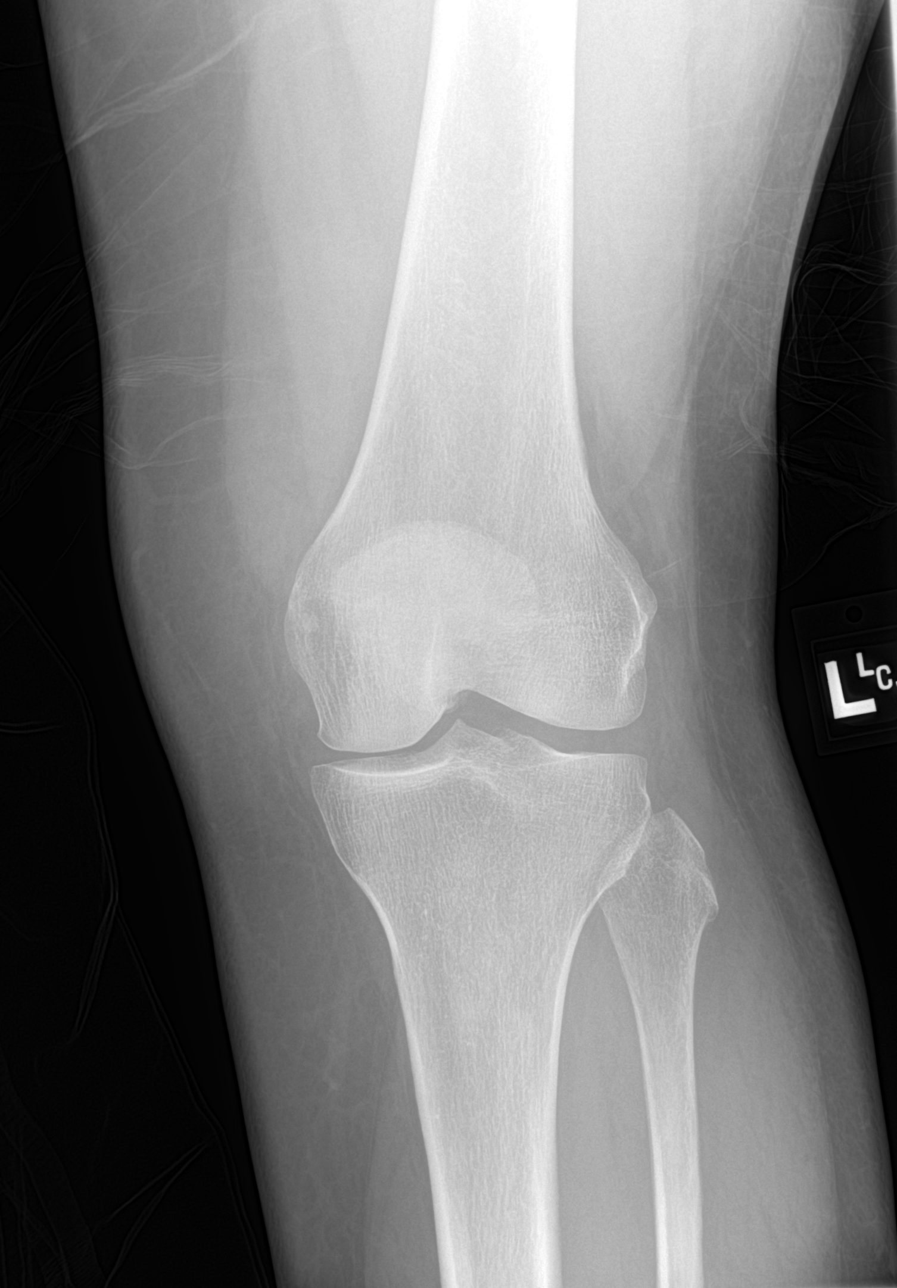

[knee obl (2 of 2)]
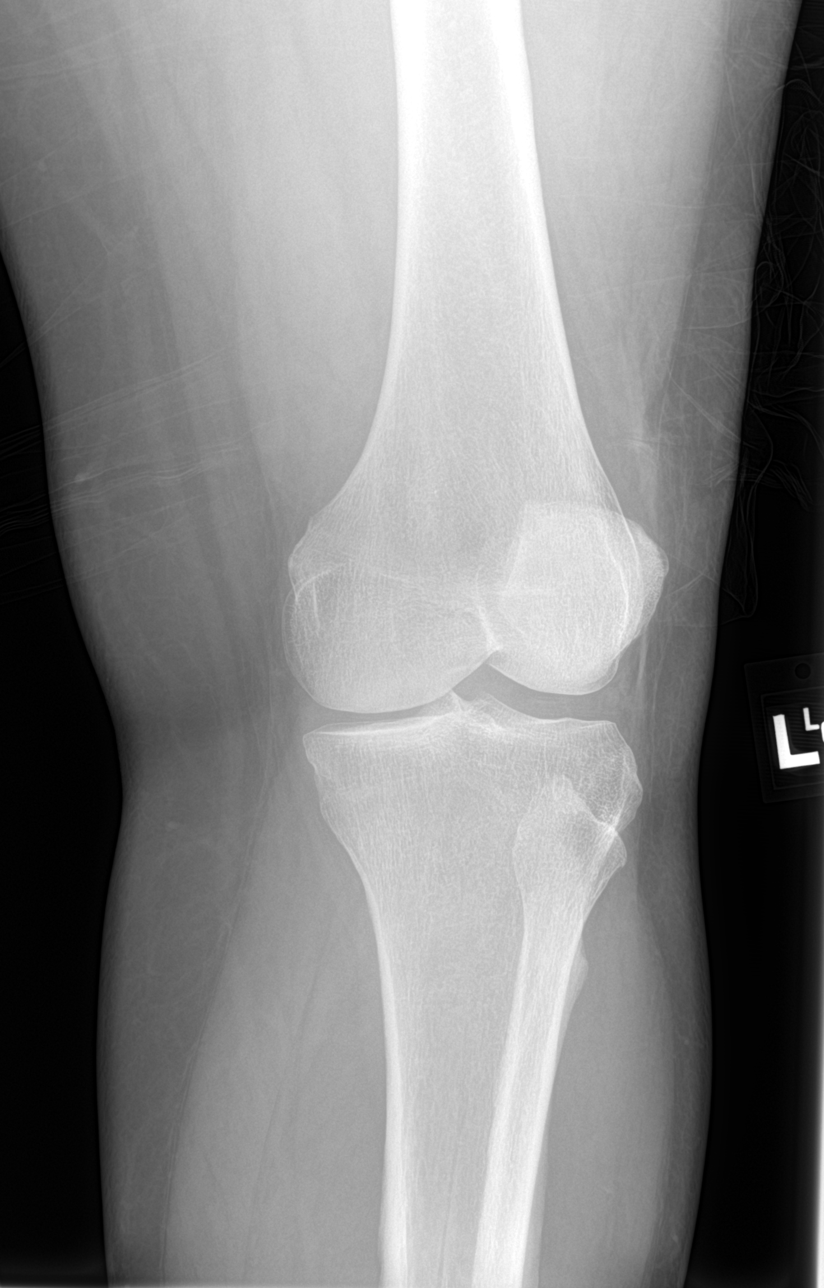

[4 of 4 positions shown; findings below may reference images not displayed]

FINDINGS: No evidence of fracture, dislocation, or joint effusion. No evidence
of arthropathy or other focal bone abnormality. Soft tissues are
unremarkable.
IMPRESSION: Negative.

## 2022-09-03 ENCOUNTER — Ambulatory Visit (HOSPITAL_COMMUNITY)
Admission: EM | Admit: 2022-09-03 | Discharge: 2022-09-03 | Disposition: A | Discharge status: Home / Self Care | Payer: No Typology Code available for payment source | Attending: Physician Assistant | Admitting: Physician Assistant

## 2022-09-03 DIAGNOSIS — M79602 Pain in left arm: Secondary | ICD-10-CM

## 2022-09-03 DIAGNOSIS — M62838 Other muscle spasm: Secondary | ICD-10-CM

## 2022-09-03 MED ORDER — PREDNISONE 10 MG (21) PO TBPK: ORAL_TABLET | Freq: Every day | ORAL | 0 refills | Status: DC

## 2022-09-03 MED ORDER — CYCLOBENZAPRINE HCL 5 MG PO TABS: 10.0000 mg | ORAL_TABLET | Freq: Three times a day (TID) | ORAL | 0 refills | Status: AC | PRN

## 2022-09-03 NOTE — ED Provider Notes (Signed)
[facility]    CSN: [id number] Arrival date & time: 09/03/22  1516      History   Chief Complaint No chief complaint on file.   HPI Madison Blackburn is a 46 y.o. female.   Pt complains of left sided neck pain with radiation to her left arm.  Reports pain around the left elbow too.  She denies injury or trauma.  Has had trouble sleeping due to sx. 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  She has take tylenol or ibuprofen with minimal improvement.     Past Medical History:  Diagnosis Date   DDD (degenerative disc disease), lumbar    DDD (degenerative disc disease), lumbar    Sciatica    Scoliosis     There are no problems to display for this patient.   Past Surgical History:  Procedure Laterality Date   CHOLECYSTECTOMY      OB History   No obstetric history on file.      Home Medications    Prior to Admission medications   Medication Sig Start Date End Date Taking? Authorizing Provider  cyclobenzaprine (FLEXERIL) 5 MG tablet Take 2 tablets (10 mg total) by mouth 3 (three) times daily as needed for up to 7 days for muscle spasms. 09/03/22 09/10/22 Yes Ward, Lenise Arena, PA-C  predniSONE (STERAPRED UNI-PAK 21 TAB) 10 MG (21) TBPK tablet Take by mouth daily. Take 6 tabs by mouth daily  for 2 days, then 5 tabs for 2 days, then 4 tabs for 2 days, then 3 tabs for 2 days, 2 tabs for 2 days, then 1 tab by mouth daily for 2 days 09/03/22  Yes Ward, Lenise Arena, PA-C  HYDROcodone-acetaminophen (NORCO) 5-325 MG tablet Take 1 tablet by mouth every 6 (six) hours as needed for moderate pain. 12/08/19   Robyn Haber, MD  meloxicam (MOBIC) 15 MG tablet 1 daily with food for inflammation. 01/30/21   Johnn Hai, PA-C  methocarbamol (ROBAXIN) 500 MG tablet Take 1 tablet (500 mg total) by mouth 2 (two) times daily. 05/21/20   Daleen Bo, MD  phenazopyridine (PYRIDIUM) 200 MG tablet Take 1 tablet (200 mg total) by mouth 3 (three) times daily. 01/03/20    Kem Parkinson, PA-C    Family History Family History  Family history unknown: Yes    Social History Social History   Tobacco Use   Smoking status: Some Days    Packs/day: 0.50    Types: Cigarettes   Smokeless tobacco: Never  Vaping Use   Vaping Use: Never used  Substance Use Topics   Alcohol use: No   Drug use: No     Allergies   Celebrex [celecoxib] and Latex   Review of Systems Review of Systems  Constitutional:  Negative for chills and fever.  HENT:  Negative for ear pain and sore throat.   Eyes:  Negative for pain and visual disturbance.  Respiratory:  Negative for cough and shortness of breath.   Cardiovascular:  Negative for chest pain and palpitations.  Gastrointestinal:  Negative for abdominal pain and vomiting.  Genitourinary:  Negative for dysuria and hematuria.  Musculoskeletal:  Positive for arthralgias and neck pain. Negative for back pain.  Skin:  Negative for color change and rash.  Neurological:  Negative for seizures and syncope.  All other systems reviewed and are negative.    Physical Exam Triage Vital Signs ED Triage Vitals [09/03/22 1609]  Enc Vitals Group  BP (!) 153/87     Pulse Rate 81     Resp 18     Temp 98.4 F (36.9 C)     Temp Source Oral     SpO2 98 %     Weight      Height      Head Circumference      Peak Flow      Pain Score      Pain Loc      Pain Edu?      Excl. in [place]?    No data found.  Updated Vital Signs BP (!) 153/87 (BP Location: Left Arm)   Pulse 81   Temp 98.4 F (36.9 C) (Oral)   Resp 18   SpO2 98%   Visual Acuity Right Eye Distance:   Left Eye Distance:   Bilateral Distance:    Right Eye Near:   Left Eye Near:    Bilateral Near:     Physical Exam Vitals and nursing note reviewed.  Constitutional:      General: She is not in acute distress.    Appearance: She is well-developed.  HENT:     Head: Normocephalic and atraumatic.  Eyes:     Conjunctiva/sclera: Conjunctivae normal.   Neck:   Cardiovascular:     Rate and Rhythm: Normal rate and regular rhythm.     Heart sounds: No murmur heard. Pulmonary:     Effort: Pulmonary effort is normal. No respiratory distress.     Breath sounds: Normal breath sounds.  Abdominal:     Palpations: Abdomen is soft.     Tenderness: There is no abdominal tenderness.  Musculoskeletal:        General: No swelling.       Arms:     Cervical back: Neck supple.  Skin:    General: Skin is warm and dry.     Capillary Refill: Capillary refill takes less than 2 seconds.  Neurological:     Mental Status: She is alert.  Psychiatric:        Mood and Affect: Mood normal.      UC Treatments / Results  Labs (all labs ordered are listed, but only abnormal results are displayed) Labs Reviewed - No data to display  EKG   Radiology No results found.  Procedures Procedures (including critical care time)  Medications Ordered in UC Medications - No data to display  Initial Impression / Assessment and Plan / UC Course  I have reviewed the triage vital signs and the nursing notes.  Pertinent labs & imaging results that were available during my care of the patient were reviewed by me and considered in my medical decision making (see chart for details).     Possible radicular sx from neck vs lateral epicondylitis.  Will treat with prednisone and flexeril as she does have left trapezius spasm on exam.  Supportive care discussed.  Ortho follow up recommended if no improvement.  Final Clinical Impressions(s) / UC Diagnoses   Final diagnoses:  Neck muscle spasm  Left arm pain     Discharge Instructions      Take prednisone as prescribed Take Flexeril as needed for muscle spasm Recommend daily stretching Apply ice to affected area  If no improvement follow up with orthopedics    ED Prescriptions     Medication Sig Dispense Auth. Provider   predniSONE (STERAPRED UNI-PAK 21 TAB) 10 MG (21) TBPK tablet Take by mouth  daily. Take 6 tabs by mouth daily  for 2 days, then 5 tabs for 2 days, then 4 tabs for 2 days, then 3 tabs for 2 days, 2 tabs for 2 days, then 1 tab by mouth daily for 2 days 42 tablet Ward, Lenise Arena, PA-C   cyclobenzaprine (FLEXERIL) 5 MG tablet Take 2 tablets (10 mg total) by mouth 3 (three) times daily as needed for up to 7 days for muscle spasms. 21 tablet Ward, Lenise Arena, PA-C      PDMP not reviewed this encounter.   Ward, Lenise Arena, PA-C 09/03/22 1734

## 2022-09-03 NOTE — Discharge Instructions (Addendum)
Take prednisone as prescribed Take Flexeril as needed for muscle spasm Recommend daily stretching Apply ice to affected area  If no improvement follow up with orthopedics

## 2022-09-03 NOTE — ED Triage Notes (Signed)
Pt reports left neck and shoulder pain that radiates down to her left arm. 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  Denies any trauma or falls.

## 2024-04-14 ENCOUNTER — Encounter: Payer: Self-pay | Admitting: Physical Medicine and Rehabilitation

## 2024-06-16 ENCOUNTER — Encounter: Payer: Self-pay | Attending: Physical Medicine and Rehabilitation | Admitting: Physical Medicine and Rehabilitation

## 2024-06-16 ENCOUNTER — Encounter: Payer: Self-pay | Admitting: Physical Medicine and Rehabilitation

## 2024-06-16 VITALS — BP 119/75 | HR 73 | Ht 64.0 in | Wt 244.0 lb

## 2024-06-16 DIAGNOSIS — G8929 Other chronic pain: Secondary | ICD-10-CM | POA: Diagnosis present

## 2024-06-16 DIAGNOSIS — M545 Low back pain, unspecified: Secondary | ICD-10-CM | POA: Diagnosis not present

## 2024-06-16 DIAGNOSIS — Z9049 Acquired absence of other specified parts of digestive tract: Secondary | ICD-10-CM | POA: Diagnosis not present

## 2024-06-16 DIAGNOSIS — G894 Chronic pain syndrome: Secondary | ICD-10-CM | POA: Insufficient documentation

## 2024-06-16 DIAGNOSIS — Z79899 Other long term (current) drug therapy: Secondary | ICD-10-CM | POA: Insufficient documentation

## 2024-06-16 DIAGNOSIS — Z5181 Encounter for therapeutic drug level monitoring: Secondary | ICD-10-CM | POA: Insufficient documentation

## 2024-06-16 MED ORDER — JOURNAVX 50 MG PO TABS: 1.0000 | ORAL_TABLET | Freq: Every day | ORAL | 3 refills | Status: AC | PRN

## 2024-06-16 NOTE — Progress Notes (Signed)
 Subjective:    Patient ID: Madison Blackburn, female    DOB: [birth year], 48 y.o.   MRN: [id number]  HPI Madison Blackburn is a 48 year old woman who presents to establish are for chronic low back pain.  1) Chronic low back pain: -failed tylenol , patches, heat, percocet, muscle relaxers -it started her in her 30s -medication helps -her husband and her try to do walking -she takes percocet 3 10mg -325 per day- only as needed -Xrs show arthritis  2) s/p gallbladder removal -abdominal pain improved   Pain Inventory Average Pain 7 Pain Right Now 8 My pain is intermittent, constant, burning, dull, stabbing, and aching  In the last 24 hours, has pain interfered with the following? General activity 4 Relation with others 4 Enjoyment of life 4 What TIME of day is your pain at its worst? morning , daytime, evening, night, and varies Sleep (in general) Fair  Pain is worse with: walking, bending, and standing Pain improves with: rest and medication Relief from Meds: 8  walk without assistance use a cane how many minutes can you walk? 15 ability to climb steps?  yes do you drive?  no transfers alone Do you have any goals in this area?  yes  disabled: date disabled Applied I need assistance with the following:  household duties and shopping Do you have any goals in this area?  yes  No problems in this area  Any changes since last visit?  no  Any changes since last visit?  no    Family History  Family history unknown: Yes   Social History   Socioeconomic History   Marital status: Legally Separated    Spouse name: Not on file   Number of children: Not on file   Years of education: Not on file   Highest education level: Not on file  Occupational History   Not on file  Tobacco Use   Smoking status: Every Day    Current packs/day: 0.50    Types: Cigarettes   Smokeless tobacco: Never  Vaping Use   Vaping status: Never Used  Substance and Sexual Activity   Alcohol use: No    Drug use: No   Sexual activity: Not on file  Other Topics Concern   Not on file  Social History Narrative   Not on file   Social Drivers of Health   Financial Resource Strain: Not on file  Food Insecurity: Not on file  Transportation Needs: Not on file  Physical Activity: Not on file  Stress: Not on file  Social Connections: Unknown (08/10/2023)   Received [facility]   Social Network    Social Network: Not on file   Past Surgical History:  Procedure Laterality Date   CHOLECYSTECTOMY     Past Medical History:  Diagnosis Date   DDD (degenerative disc disease), lumbar    DDD (degenerative disc disease), lumbar    Sciatica    Scoliosis    BP 119/75   Pulse 73   Ht 5' 4 (1.626 m)   Wt 244 lb (110.7 kg)   SpO2 95%   BMI 41.88 kg/m   Opioid Risk Score:   Fall Risk Score:  `1  Depression screen [facility] 2/9     06/16/2024   12:48 PM 01/08/2018    5:08 PM 07/29/2016    9:53 AM  Depression screen PHQ 2/9  Decreased Interest 1 0 0  Down, Depressed, Hopeless 0 0 0  PHQ - 2 Score 1 0  0  Altered sleeping 2    Tired, decreased energy 0    Change in appetite 0    Feeling bad or failure about yourself  0    Trouble concentrating 0    Moving slowly or fidgety/restless 0    Suicidal thoughts 0    PHQ-9 Score 3      Review of Systems  Musculoskeletal:  Positive for back pain.       Left hip pain  All other systems reviewed and are negative.      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 Discussed that  this is a capsaicin patch, stronger than capsaicin cream. Discussed that it is currently approved for diabetic peripheral neuropathy and post-herpetic neuralgia, but that it has also shown benefit in treating other forms of neuropathy. Provided patient with link to site to learn more about the patch: https://www.clark.biz/. Discussed that the patch would be placed in office and benefits usually last 3 months. Discussed that unintended exposure to capsaicin can cause severe irritation of eyes, mucous membranes, respiratory tract, and skin, but that Qutenza is a local treatment and does not have the systemic side effects of other nerve medications. Discussed that there may be pain, itching, erythema, and decreased sensory function associated with the application of Qutenza. Side effects usually subside within 1 week. A cold pack of analgesic medications can help with these side effects. Blood pressure can also be increased due to pain associated with administration of the patch.   -discussed that Journavx  is a highly selective inhibitor for Nav 1.8, which is specific for pain in the peripheral nervous system, discussed that lidocaine in contrast affects all Nav receptors, discussed that patient can try using this medication as prn for pain, though its studies have focused on its use for acute pain. Discussed that it has been studied against opioids for acute pain with comparable efficacy. Discussed that I have not seen any patient side effects thus far. Discussed that we have samples available and we have copay cards available. Discussed that outpatient if the medication requires a prior auth the copay should be $30 for at least a 60 day supply. The medication may be more likely to be in stock in [facility] and [facility]. We do have samples available.   .discussed benefits of core strengthening  -pain contract and urine sample performed today, if contains expected metabolities, will send for her Perocet 10mg -325mg   TID prn  -Discussed current symptoms of pain and history of pain.  -Discussed benefits of exercise in reducing pain. -Discussed following foods that may reduce pain: 1) Ginger (especially studied for arthritis)- reduce leukotriene production to decrease inflammation 2) Blueberries- high in phytonutrients that decrease inflammation 3) Salmon- marine omega-3s reduce joint swelling and pain 4) Pumpkin seeds- reduce inflammation 5) dark chocolate- reduces inflammation 6) turmeric- reduces inflammation 7) tart cherries - reduce pain and stiffness 8) extra virgin olive oil - its compound olecanthal helps to block prostaglandins  9) chili peppers- can be eaten or applied topically via capsaicin 10) mint-  helpful for headache, muscle aches, joint pain, and itching 11) garlic- reduces inflammation 12) Green tea- reduces inflammation and oxidative stress, helps with weight loss, may reduce the risk of cancer, recommend Double Green Matcha [place] of Tea daily  Link to further information on diet for chronic pain: http://www.bray.com/

## 2024-06-18 ENCOUNTER — Telehealth: Payer: Self-pay | Admitting: Physical Medicine and Rehabilitation

## 2024-06-18 NOTE — Telephone Encounter (Signed)
 P called and wanted to know did her drug screen come back yet

## 2024-06-18 NOTE — Telephone Encounter (Signed)
 Replied via MyChart. Results take up to a week to come back.

## 2024-06-19 ENCOUNTER — Telehealth: Payer: Self-pay | Admitting: Physical Medicine and Rehabilitation

## 2024-06-19 ENCOUNTER — Encounter: Payer: Self-pay | Admitting: Physical Medicine and Rehabilitation

## 2024-06-19 NOTE — Telephone Encounter (Signed)
 Pt called and left a voicemail regarding her Oxycodone  10-325.

## 2024-06-20 NOTE — Telephone Encounter (Signed)
 It looks like the UDS should have been collected 06/16/24 and has not resulted yet. Should result sometime today.

## 2024-06-26 LAB — TOXASSURE SELECT,+ANTIDEPR,UR

## 2024-06-27 ENCOUNTER — Encounter: Attending: Physical Medicine and Rehabilitation | Admitting: Physical Medicine and Rehabilitation

## 2024-06-27 DIAGNOSIS — M545 Low back pain, unspecified: Secondary | ICD-10-CM | POA: Insufficient documentation

## 2024-06-27 DIAGNOSIS — G8929 Other chronic pain: Secondary | ICD-10-CM | POA: Diagnosis not present

## 2024-06-29 NOTE — Progress Notes (Signed)
 Subjective:    Patient ID: Madison Blackburn, female    DOB: August 14, 1976, 48 y.o.   MRN: [id number]  HPI An audio/video tele-health visit is felt to be the most appropriate encounter for this patient at this time. This is a follow up tele-visit via phone. The patient is at home. MD is at office. Prior to scheduling this appointment, our staff discussed the limitations of evaluation and management by telemedicine and the availability of in-person appointments. The patient expressed understanding and agreed to proceed.   [name] is a 48 year old woman who presents to establish are for chronic low back pain.  1) Chronic low back pain: -discussed that her urine sample states not a human specimen, she says she went straight to the lab to give a sample after our visit -failed tylenol , patches, heat, percocet, muscle relaxers -it started her in her 30s -medication helps -her husband and her try to do walking -she takes percocet 3 10mg -325 per day- only as needed -Xrs show arthritis  2) s/p gallbladder removal -abdominal pain improved   Pain Inventory Average Pain 7 Pain Right Now 8 My pain is intermittent, constant, burning, dull, stabbing, and aching  In the last 24 hours, has pain interfered with the following? General activity 4 Relation with others 4 Enjoyment of life 4 What TIME of day is your pain at its worst? morning , daytime, evening, night, and varies Sleep (in general) Fair  Pain is worse with: walking, bending, and standing Pain improves with: rest and medication Relief from Meds: 8  walk without assistance use a cane how many minutes can you walk? 15 ability to climb steps?  yes do you drive?  no transfers alone Do you have any goals in this area?  yes  disabled: date disabled Applied I need assistance with the following:  household duties and shopping Do you have any goals in this area?  yes  No problems in this area  Any changes since last visit?  no  Any  changes since last visit?  no    Family History  Family history unknown: Yes   Social History   Socioeconomic History   Marital status: Legally Separated    Spouse name: Not on file   Number of children: Not on file   Years of education: Not on file   Highest education level: Not on file  Occupational History   Not on file  Tobacco Use   Smoking status: Every Day    Current packs/day: 0.50    Types: Cigarettes   Smokeless tobacco: Never  Vaping Use   Vaping status: Never Used  Substance and Sexual Activity   Alcohol use: No   Drug use: No   Sexual activity: Not on file  Other Topics Concern   Not on file  Social History Narrative   Not on file   Social Drivers of Health   Financial Resource Strain: Not on file  Food Insecurity: Not on file  Transportation Needs: Not on file  Physical Activity: Not on file  Stress: Not on file  Social Connections: Unknown (08/10/2023)   Received from [facility]   Social Network    Social Network: Not on file   Past Surgical History:  Procedure Laterality Date   CHOLECYSTECTOMY     Past Medical History:  Diagnosis Date   DDD (degenerative disc disease), lumbar    DDD (degenerative disc disease), lumbar    Sciatica    Scoliosis  There were no vitals taken for this visit.  Opioid Risk Score:   Fall Risk Score:  `1  Depression screen [facility] 2/9     06/16/2024   12:48 PM 01/08/2018    5:08 PM 07/29/2016    9:53 AM  Depression screen PHQ 2/9  Decreased Interest 1 0 0  Down, Depressed, Hopeless 0 0 0  PHQ - 2 Score 1 0 0  Altered sleeping 2    Tired, decreased energy 0    Change in appetite 0    Feeling bad or failure about yourself  0    Trouble concentrating 0    Moving slowly or fidgety/restless 0    Suicidal thoughts 0    PHQ-9 Score 3      Review of Systems  Musculoskeletal:  Positive for back pain.       Left hip pain  All other systems reviewed and are negative.      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 Discussed that this is a capsaicin patch, stronger than capsaicin cream. Discussed that it is currently approved for diabetic peripheral neuropathy and post-herpetic neuralgia, but that it has also shown benefit in treating other forms of neuropathy. Provided patient with link to site to learn more about the patch: https://www.clark.biz/. Discussed that the patch would be placed in office and benefits usually last 3 months. Discussed that unintended exposure to capsaicin can cause severe irritation of eyes, mucous membranes, respiratory tract, and skin, but that Qutenza is a local treatment and does not have the systemic side effects of other nerve medications. Discussed that there may be pain, itching, erythema, and decreased sensory function associated with the application of Qutenza. Side effects usually subside within 1 week. A cold pack of analgesic medications can help with these side effects. Blood pressure can also be increased due to pain associated with administration of the patch.   -discussed that Journavx  is a highly selective inhibitor for Nav 1.8, which is specific for pain in the peripheral nervous system, discussed that lidocaine in contrast affects all Nav receptors, discussed that patient can try using this medication as prn for pain,  though its studies have focused on its use for acute pain. Discussed that it has been studied against opioids for acute pain with comparable efficacy. Discussed that I have not seen any patient side effects thus far. Discussed that we have samples available and we have copay cards available. Discussed that outpatient if the medication requires a prior auth the copay should be $30 for at least a 60 day supply. The medication may be more likely to be in stock in [facility] and [facility]. We do have samples available.   .discussed benefits of core strengthening  -pain contract and urine sample performed today, if contains expected metabolities, will send for her Perocet 10mg -325mg  TID prn  -Discussed current symptoms of  pain and history of pain.  -Discussed benefits of exercise in reducing pain. -Discussed following foods that may reduce pain: 1) Ginger (especially studied for arthritis)- reduce leukotriene production to decrease inflammation 2) Blueberries- high in phytonutrients that decrease inflammation 3) Salmon- marine omega-3s reduce joint swelling and pain 4) Pumpkin seeds- reduce inflammation 5) dark chocolate- reduces inflammation 6) turmeric- reduces inflammation 7) tart cherries - reduce pain and stiffness 8) extra virgin olive oil - its compound olecanthal helps to block prostaglandins  9) chili peppers- can be eaten or applied topically via capsaicin 10) mint- helpful for headache, muscle aches, joint pain, and itching 11) garlic- reduces inflammation 12) Green tea- reduces inflammation and oxidative stress, helps with weight loss, may reduce the risk of cancer, recommend Double [employer] [place] of Tea daily  Link to further information on diet for chronic pain: http://www.bray.com/   5 minutes spent in discussion of patient's pain, non-opioid medications for pain, her urine sample results, that we can provide her  a referral to a different pain clinic if she would like

## 2024-07-02 ENCOUNTER — Ambulatory Visit: Admitting: Podiatry

## 2024-07-09 ENCOUNTER — Ambulatory Visit: Admitting: Podiatry

## 2024-07-10 ENCOUNTER — Encounter: Admitting: Registered Nurse

## 2024-09-16 ENCOUNTER — Ambulatory Visit: Admitting: Physical Medicine and Rehabilitation
